# Patient Record
Sex: Female | Born: 1950
Health system: Southern US, Community
[De-identification: ages and names within clinical notes are randomized; demographics above are authoritative.]

## PROBLEM LIST (undated history)

## (undated) DIAGNOSIS — M1990 Unspecified osteoarthritis, unspecified site: Secondary | ICD-10-CM

## (undated) DIAGNOSIS — R0600 Dyspnea, unspecified: Secondary | ICD-10-CM

## (undated) DIAGNOSIS — E669 Obesity, unspecified: Secondary | ICD-10-CM

## (undated) DIAGNOSIS — F4310 Post-traumatic stress disorder, unspecified: Secondary | ICD-10-CM

## (undated) DIAGNOSIS — R531 Weakness: Secondary | ICD-10-CM

## (undated) DIAGNOSIS — H409 Unspecified glaucoma: Secondary | ICD-10-CM

## (undated) DIAGNOSIS — L853 Xerosis cutis: Secondary | ICD-10-CM

## (undated) DIAGNOSIS — M51379 Other intervertebral disc degeneration, lumbosacral region without mention of lumbar back pain or lower extremity pain: Secondary | ICD-10-CM

## (undated) DIAGNOSIS — G8929 Other chronic pain: Secondary | ICD-10-CM

## (undated) DIAGNOSIS — Z8659 Personal history of other mental and behavioral disorders: Secondary | ICD-10-CM

## (undated) DIAGNOSIS — M8580 Other specified disorders of bone density and structure, unspecified site: Secondary | ICD-10-CM

## (undated) DIAGNOSIS — F329 Major depressive disorder, single episode, unspecified: Secondary | ICD-10-CM

## (undated) DIAGNOSIS — M2550 Pain in unspecified joint: Secondary | ICD-10-CM

## (undated) DIAGNOSIS — Z87820 Personal history of traumatic brain injury: Secondary | ICD-10-CM

## (undated) DIAGNOSIS — Z8619 Personal history of other infectious and parasitic diseases: Secondary | ICD-10-CM

## (undated) DIAGNOSIS — R238 Other skin changes: Secondary | ICD-10-CM

## (undated) DIAGNOSIS — Z9889 Other specified postprocedural states: Secondary | ICD-10-CM

## (undated) DIAGNOSIS — J449 Chronic obstructive pulmonary disease, unspecified: Secondary | ICD-10-CM

## (undated) DIAGNOSIS — M549 Dorsalgia, unspecified: Secondary | ICD-10-CM

## (undated) DIAGNOSIS — K5730 Diverticulosis of large intestine without perforation or abscess without bleeding: Secondary | ICD-10-CM

## (undated) DIAGNOSIS — K581 Irritable bowel syndrome with constipation: Secondary | ICD-10-CM

## (undated) DIAGNOSIS — M797 Fibromyalgia: Secondary | ICD-10-CM

## (undated) DIAGNOSIS — F419 Anxiety disorder, unspecified: Secondary | ICD-10-CM

## (undated) DIAGNOSIS — G500 Trigeminal neuralgia: Secondary | ICD-10-CM

## (undated) DIAGNOSIS — M810 Age-related osteoporosis without current pathological fracture: Secondary | ICD-10-CM

## (undated) DIAGNOSIS — M5137 Other intervertebral disc degeneration, lumbosacral region: Secondary | ICD-10-CM

## (undated) DIAGNOSIS — K6282 Dysplasia of anus: Secondary | ICD-10-CM

## (undated) DIAGNOSIS — G4700 Insomnia, unspecified: Secondary | ICD-10-CM

## (undated) DIAGNOSIS — Z8709 Personal history of other diseases of the respiratory system: Secondary | ICD-10-CM

## (undated) DIAGNOSIS — R7303 Prediabetes: Secondary | ICD-10-CM

## (undated) DIAGNOSIS — R42 Dizziness and giddiness: Secondary | ICD-10-CM

## (undated) DIAGNOSIS — K5909 Other constipation: Secondary | ICD-10-CM

## (undated) DIAGNOSIS — N3010 Interstitial cystitis (chronic) without hematuria: Secondary | ICD-10-CM

## (undated) DIAGNOSIS — Z8489 Family history of other specified conditions: Secondary | ICD-10-CM

## (undated) DIAGNOSIS — R350 Frequency of micturition: Secondary | ICD-10-CM

## (undated) DIAGNOSIS — H269 Unspecified cataract: Secondary | ICD-10-CM

## (undated) DIAGNOSIS — R112 Nausea with vomiting, unspecified: Secondary | ICD-10-CM

## (undated) DIAGNOSIS — K219 Gastro-esophageal reflux disease without esophagitis: Secondary | ICD-10-CM

## (undated) DIAGNOSIS — R3915 Urgency of urination: Secondary | ICD-10-CM

## (undated) DIAGNOSIS — K589 Irritable bowel syndrome without diarrhea: Secondary | ICD-10-CM

## (undated) DIAGNOSIS — M2540 Effusion, unspecified joint: Secondary | ICD-10-CM

## (undated) DIAGNOSIS — D649 Anemia, unspecified: Secondary | ICD-10-CM

## (undated) DIAGNOSIS — M5030 Other cervical disc degeneration, unspecified cervical region: Secondary | ICD-10-CM

## (undated) DIAGNOSIS — F32A Depression, unspecified: Secondary | ICD-10-CM

## (undated) DIAGNOSIS — F411 Generalized anxiety disorder: Secondary | ICD-10-CM

## (undated) DIAGNOSIS — C4490 Unspecified malignant neoplasm of skin, unspecified: Secondary | ICD-10-CM

## (undated) DIAGNOSIS — F410 Panic disorder [episodic paroxysmal anxiety] without agoraphobia: Secondary | ICD-10-CM

## (undated) DIAGNOSIS — I10 Essential (primary) hypertension: Secondary | ICD-10-CM

## (undated) HISTORY — PX: COLONOSCOPY WITH ESOPHAGOGASTRODUODENOSCOPY (EGD): SHX5779

## (undated) HISTORY — PX: LEG SURGERY: SHX1003

## (undated) HISTORY — PX: ESOPHAGOGASTRODUODENOSCOPY: SHX1529

## (undated) HISTORY — DX: Obesity, unspecified: E66.9

## (undated) HISTORY — PX: VAGINAL HYSTERECTOMY: SUR661

## (undated) HISTORY — DX: Chronic obstructive pulmonary disease, unspecified: J44.9

## (undated) HISTORY — PX: FOOT SURGERY: SHX648

## (undated) HISTORY — DX: Interstitial cystitis (chronic) without hematuria: N30.10

## (undated) HISTORY — PX: COLONOSCOPY: SHX174

---

## 1998-03-06 ENCOUNTER — Other Ambulatory Visit: Admission: RE | Admit: 1998-03-06 | Discharge: 1998-03-06 | Payer: Self-pay | Admitting: Obstetrics and Gynecology

## 1998-11-19 ENCOUNTER — Emergency Department (HOSPITAL_COMMUNITY): Admission: EM | Admit: 1998-11-19 | Discharge: 1998-11-19 | Payer: Self-pay | Admitting: Emergency Medicine

## 1998-11-19 ENCOUNTER — Encounter: Payer: Self-pay | Admitting: Urology

## 1998-11-19 ENCOUNTER — Ambulatory Visit (HOSPITAL_COMMUNITY): Admission: RE | Admit: 1998-11-19 | Discharge: 1998-11-19 | Payer: Self-pay | Admitting: Urology

## 1998-11-20 ENCOUNTER — Encounter: Payer: Self-pay | Admitting: Urology

## 1998-11-20 ENCOUNTER — Ambulatory Visit (HOSPITAL_COMMUNITY): Admission: RE | Admit: 1998-11-20 | Discharge: 1998-11-20 | Payer: Self-pay | Admitting: Urology

## 1999-02-04 ENCOUNTER — Encounter: Payer: Self-pay | Admitting: Gastroenterology

## 1999-02-04 ENCOUNTER — Ambulatory Visit (HOSPITAL_COMMUNITY): Admission: RE | Admit: 1999-02-04 | Discharge: 1999-02-04 | Payer: Self-pay | Admitting: Gastroenterology

## 1999-02-20 ENCOUNTER — Encounter: Payer: Self-pay | Admitting: Gastroenterology

## 1999-02-20 ENCOUNTER — Ambulatory Visit (HOSPITAL_COMMUNITY): Admission: RE | Admit: 1999-02-20 | Discharge: 1999-02-20 | Payer: Self-pay | Admitting: Gastroenterology

## 1999-02-27 ENCOUNTER — Encounter: Admission: RE | Admit: 1999-02-27 | Discharge: 1999-05-28 | Payer: Self-pay | Admitting: Gastroenterology

## 1999-03-11 ENCOUNTER — Other Ambulatory Visit: Admission: RE | Admit: 1999-03-11 | Discharge: 1999-03-11 | Payer: Self-pay | Admitting: Obstetrics and Gynecology

## 2000-03-12 ENCOUNTER — Other Ambulatory Visit: Admission: RE | Admit: 2000-03-12 | Discharge: 2000-03-12 | Payer: Self-pay | Admitting: Obstetrics and Gynecology

## 2001-04-20 ENCOUNTER — Other Ambulatory Visit: Admission: RE | Admit: 2001-04-20 | Discharge: 2001-04-20 | Payer: Self-pay | Admitting: Obstetrics and Gynecology

## 2004-05-22 ENCOUNTER — Other Ambulatory Visit: Admission: RE | Admit: 2004-05-22 | Discharge: 2004-05-22 | Payer: Self-pay | Admitting: Obstetrics and Gynecology

## 2004-05-31 ENCOUNTER — Emergency Department (HOSPITAL_COMMUNITY): Admission: EM | Admit: 2004-05-31 | Discharge: 2004-06-01 | Payer: Self-pay | Admitting: Emergency Medicine

## 2004-06-01 ENCOUNTER — Ambulatory Visit: Payer: Self-pay | Admitting: Psychiatry

## 2004-06-01 ENCOUNTER — Inpatient Hospital Stay (HOSPITAL_COMMUNITY): Admission: EM | Admit: 2004-06-01 | Discharge: 2004-06-03 | Payer: Self-pay | Admitting: Psychiatry

## 2004-08-09 ENCOUNTER — Ambulatory Visit: Payer: Self-pay | Admitting: Psychiatry

## 2004-08-09 ENCOUNTER — Other Ambulatory Visit (HOSPITAL_COMMUNITY): Admission: RE | Admit: 2004-08-09 | Discharge: 2004-11-07 | Payer: Self-pay | Admitting: Psychiatry

## 2005-07-14 HISTORY — PX: ABDOMINAL HYSTERECTOMY: SHX81

## 2005-12-01 ENCOUNTER — Inpatient Hospital Stay (HOSPITAL_COMMUNITY): Admission: RE | Admit: 2005-12-01 | Discharge: 2005-12-02 | Payer: Self-pay | Admitting: Obstetrics and Gynecology

## 2005-12-01 ENCOUNTER — Encounter (INDEPENDENT_AMBULATORY_CARE_PROVIDER_SITE_OTHER): Payer: Self-pay | Admitting: *Deleted

## 2009-03-01 ENCOUNTER — Ambulatory Visit (HOSPITAL_COMMUNITY): Admission: RE | Admit: 2009-03-01 | Discharge: 2009-03-01 | Payer: Self-pay | Admitting: Rheumatology

## 2009-05-14 HISTORY — PX: FLAT FOOT CORRECTION: SHX6619

## 2009-05-28 ENCOUNTER — Ambulatory Visit (HOSPITAL_BASED_OUTPATIENT_CLINIC_OR_DEPARTMENT_OTHER): Admission: RE | Admit: 2009-05-28 | Discharge: 2009-05-29 | Payer: Self-pay | Admitting: Orthopedic Surgery

## 2010-04-02 ENCOUNTER — Emergency Department (HOSPITAL_COMMUNITY): Admission: EM | Admit: 2010-04-02 | Discharge: 2010-04-02 | Payer: Self-pay | Admitting: Emergency Medicine

## 2010-10-31 ENCOUNTER — Other Ambulatory Visit (HOSPITAL_COMMUNITY): Payer: Self-pay | Admitting: Rheumatology

## 2010-10-31 DIAGNOSIS — M25512 Pain in left shoulder: Secondary | ICD-10-CM

## 2010-11-05 ENCOUNTER — Ambulatory Visit (HOSPITAL_COMMUNITY)
Admission: RE | Admit: 2010-11-05 | Discharge: 2010-11-05 | Disposition: A | Discharge status: Home / Self Care | Payer: Self-pay | Source: Ambulatory Visit | Attending: Rheumatology | Admitting: Rheumatology

## 2010-11-05 DIAGNOSIS — M719 Bursopathy, unspecified: Secondary | ICD-10-CM | POA: Insufficient documentation

## 2010-11-05 DIAGNOSIS — M25519 Pain in unspecified shoulder: Secondary | ICD-10-CM | POA: Insufficient documentation

## 2010-11-05 DIAGNOSIS — M25512 Pain in left shoulder: Secondary | ICD-10-CM

## 2010-11-05 DIAGNOSIS — M67919 Unspecified disorder of synovium and tendon, unspecified shoulder: Secondary | ICD-10-CM | POA: Insufficient documentation

## 2010-11-05 DIAGNOSIS — M25419 Effusion, unspecified shoulder: Secondary | ICD-10-CM | POA: Insufficient documentation
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
## 2010-11-29 NOTE — H&P (Signed)
NAME:  [name], SOLIVAN NO.:  [id number]   MEDICAL RECORD NO.:  [id number]          PATIENT TYPE:  AMB   LOCATION:  SDC                           [facility]ILITY:  [facility]   PHYSICIAN:  Randye Lobo, M.D.   DATE OF BIRTH:  1950/10/22   DATE OF ADMISSION:  11/30/2005  DATE OF DISCHARGE:                                HISTORY & PHYSICAL   CHIEF COMPLAINT:  Vaginal prolapse and urinary incontinence.   HISTORY OF PRESENT ILLNESS:  The patient is a 60 year old, gravida 70, para 46-  0-5-2, Caucasian female with a last menstrual period in January of 2007, who  presented to her primary gynecologist, Dr. Malva Limes, for her annual  examination in March at which time she reported vaginal prolapse and urinary  incontinence of a two-year duration.  The patient was reporting pelvic  pressure and low back.   On physical exam, she was noted to have a cystocele and a rectocele.   The patient was sent for urologic evaluation due to a complaint of leakage  of urine with coughing, sneezing, running and jumping.  The patient also  reported difficulty with voiding and a urinary frequency of one to two times  per hour and a nocturnal frequency of two to three times per h.s.  The  patient does have a history of interstitial cystitis and is controlling her  symptoms with pH science which she uses to neutralize the acid in the  foods she consumes.  The patient denies any history of constipation or fecal  incontinence.   The patient had multiple channel urodynamic testing performed in the office  on September 30, 2005 at which time she was noted to have a uroflow study with a  void of 134 mL and a postvoid residual of 135 mL.  Her ` study documented a  leak point pressure of 48 cm of water.  The patient's pressure flow study,  she had a maximum detrusor pressure of 55 cm of water.   The patient wishes for surgical treatment of her prolapse and incontinence.   PAST OBSTETRIC AND GYNECOLOGIC:  The  patient had three prior vaginal  deliveries.  The patient has had five pregnancy losses.  The patient's last  Pap smear on chart review is dated May 22, 2004 and this was within  normal limits.  Her last mammogram was performed September 29, 2005 and this  showed benign calcifications in the right breast.   PAST MEDICAL HISTORY:  1.  Environmental allergies.  2.  Food allergies.  3.  Multiple drug allergies:  PENICILLIN produces a rash, KEFLEX produces an      uncertain reaction, TETRACYCLINE makes the patient feel agitated,      NONOXYNOL-9 also causes a type of reaction of which the patient is not      certain and she also has an allergy to GEODON, also to METAMUCIL.  4.  Interstitial cystitis.  5.  Low back pain.   PAST SURGICAL HISTORY:  1.  Status post D&C x2.  2.  Status post voluntary interruption of pregnancy x1.  MEDICATIONS:  The patient takes multiple herbs and she was told to stop  these at her preoperative visit on Nov 26, 2005.   ALLERGIES:  Please refer to past medical history.   SOCIAL HISTORY:  The patient is married.  She is currently unemployed.  She  denies the use of tobacco.   PHYSICAL EXAMINATION:  The patient is a middle-aged Caucasian female in no  acute distress.  Lungs clear to auscultation bilaterally.  Heart S1 and S2  with a regular rate and rhythm.  Abdomen is soft and nontender without  evidence of hepatosplenomegaly or organomegaly.  Pelvic examination normal  external genitalia and urethra.  There is a second to third degree  cystocele, first degree uterine prolapse and a second degree rectocele.  The  uterus is small and nontender.  No adnexal masses or tenderness are  appreciated.   IMPRESSION:  The patient is a 60 year old para 2 female with symptomatic and  complete uterovaginal prolapse and urodynamically proven genuine stress  incontinence.  The patient does have a history of interstitial cystitis.   PLAN:  The patient will undergo a  total vaginal hysterectomy by Dr.  Dareen Piano.  I will assist him with this procedure and I will then perform an  anterior and posterior colporrhaphy with a tension-free vaginal tape and  cystoscopy.  Dr. Dareen Piano will be assisting me for my portion of the  surgery.  Risks, benefits, and alternatives of the above have been discussed  with the patient who wishes to proceed. Surgery is scheduled for [facility] at 12 p.m. on Dec 01, 2005.      Randye Lobo, M.D.  Electronically Signed     BES/MEDQ  D:  11/30/2005  T:  11/30/2005  Job:  [id number]
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
## 2010-11-29 NOTE — Op Note (Signed)
[name], [name] NO.:  [id number]   MEDICAL RECORD NO.:  [id number]          PATIENT TYPE:  INP   LOCATION:  9399                          [facility]ILITY[facility]  [facility]   PHYSICIAN:  Randye Lobo, M.D.   DATE OF BIRTH:  06/18/1951   DATE OF PROCEDURE:  12/01/2005  DATE OF DISCHARGE:                                 OPERATIVE REPORT   PREOPERATIVE DIAGNOSIS:  1.  Incomplete uterovaginal prolapse.  2.  Genuine stress incontinence.   POSTOPERATIVE DIAGNOSIS:  1.  Incomplete uterovaginal prolapse.  2.  Genuine stress incontinence.   PROCEDURE:  McCall culdoplasty, anterior and posterior colporrhaphy, tension-  free vaginal tape, cystoscopy.   SURGEON:  Conley Simmonds, MD   ASSISTANT:  Emma Limes, MD   ANESTHESIA:  General endotracheal, local with 1% lidocaine with epinephrine  1:100,000.   TOTAL IV FLUIDS:  2100 mL Ringer's lactate.   ESTIMATED BLOOD LOSS:  300 mL   URINE OUTPUT:  300 mL.   COMPLICATIONS:  None.   INDICATIONS FOR PROCEDURE:  The patient is a 60 year old gravida 8, para 3-0-  5-2 Caucasian female who presented to her primary gynecologist, Dr. Malva [name] for annual examination and at that time reported vaginal prolapse  and urinary incontinence.  The patient was noted to have both a cystocele  and a rectocele.  She was sent for urogynecologic evaluation and underwent  urodynamic testing at which time I confirmed the presence of genuine stress  incontinence.  The patient does have a history significant for interstitial  cystitis and currently takes no medications, but does control her symptoms  with what she calls pH science to neutralize the acid in the foods she  consumes.  On physical examination, the patient was noted to have a third  degree cystocele, first degree uterine prolapse and a second-degree  rectocele.  The plan is to proceed with a total vaginal hysterectomy by Dr.  Dareen Piano and I will then perform an anterior and posterior  colporrhaphy with  a tension-free vaginal tape and cystoscopy.  Risks, benefits, and  alternatives have been discussed with the patient who wishes to proceed.   FINDINGS:  Examination under anesthesia revealed a second-degree cystocele  and first to second degree uterine prolapse and a second-degree rectocele.   Cystoscopy performed and during the sling procedure document to the absence  of a foreign body in the bladder or the urethra.  The bladder was visualized  throughout 360 degrees and had a normal bladder dome and trigone.  There was  evidence of patency of the ureters bilaterally.  After the bladder had been  filled with approximately 450-500 mL of sterile fluid, small petechial  hemorrhages were noted to appear throughout the bladder.   SPECIMENS:  None.   PROCEDURE:  The patient was brought to the operating room by Dr. Dareen Piano  and she received a general endotracheal anesthetic.  She was placed in the  dorsal lithotomy position and she was sterilely prepped and draped.  The  patient had her vaginal hysterectomy performed by Dr. Dareen Piano which was  without complication.  Please  refer to this dictation separately.   At the end of the physterectomy, the peritoneal cavity was opened and Dr.  Dareen Piano had closed the posterior vaginal cuff with a running locked suture.  The hemostasis was good.   I first performed a McCall culdoplasty using 0 Vicryl suture.  The suture  was brought through the vagina at the 6 o'clock position through the distal  left uterosacral ligament, across the posterior cul-de-sac in a pursestring  fashion and then down through the right distal right uterosacral ligament  before coming out the vagina at the 6 o'clock position.  The suture was held  until the end of the case at which time it was tied for excellent cuff  support and elevation.   Allis clamps were used and a Foley catheter was placed in the bladder.  Allis clamps were used to mark the  midline of the anterior vaginal wall  which was then injected with 1% lidocaine with 1:100,000.  The vaginal  mucosa was then incised vertically with Metzenbaum scissors.  The endopelvic  fascia was dissected off of the vaginal mucosa bilaterally all the way back  to the pubic rami.  Hemostasis was created with monopolar cautery during the  course of the dissection.  There were some small bleeding the bladder both  on the right and left side which needed to be sutured with figure-of-eight  sutures of 2-0 Vicryl to create good hemostasis.   The TVT sling was then performed in a top-down fashion.  A suprapubic 1 cm  incision was created 2 cm to the right and left of the midline using a  scalpel.  The abdominal needle passer was brought through the suprapubic  incision and out through the vagina on the ipsilateral side lateral to the  mid urethra.  The same procedure that was performed on the right-hand side  was then repeated on the left-hand side again in a top down the fashion.  The Foley catheter was removed and cystoscopy was performed at this time and  the findings were as noted above.  The cystoscopic fluid was then drained  from the bladder and the Foley catheter was replaced.  The sling was then  attached to the abdominal needle passers and drawn up through the suprapubic  incisions.  The plastic sheaths were separated from the sling and the  plastic sheaths were withdrawn while placing a Kelly clamp between the  urethra and the sling itself.  There was good mobility of the Kelly clamp  underneath 0 Vicryl distally.  This provided excellent reduction of the  cystocele.  Gelfoam was placed near the vaginal exit sites of the sling to  improve hemostasis.  Excess vaginal mucosa was then trimmed anteriorly and  the anterior vaginal wall was closed with a running locked suture of 2-0 Vicryl.  The vaginal cuff was closed with a running locked suture of 0  Vicryl.   The posterior  colporrhaphy was performed last.  Allis clamps were used to  mark the midline of the posterior vaginal wall to approximately 3 cm below  the vaginal cuff.  The posterior vaginal wall mucosa was injected with 1%  lidocaine with 1:100,000 of epinephrine.  A triangular wedge of epithelium  was excised from the perineal body and the posterior vaginal wall was  incised vertically with the Metzenbaum scissors.  The perirectal fascia was  dissected off of the overlying mucosa bilaterally.  Hemostasis during the  dissection was created with monopolar cautery.  The  rectocele was reduced by  placing a series of vertical mattress sutures of 0 Vicryl and a crown stitch  of 0 Vicryl was placed in the perineal body.  Excess vaginal mucosa was  trimmed and the posterior vaginal wall was closed with a running locked in a  subcuticular fashion as for an episiotomy.  The knot was tied at the hymen.   The culdoplasty suture was tied at this time and there was good support and  elevation of the vaginal cuff.  The suprapubic incisions were closed with  Dermabond.  A vaginal packing with Estrace cream, this concluded the  patient's procedure.  Rectal exam confirmed that absence of sutures in the  rectum.  There were no complications to the procedure.  All needle,  instrument, sponge counts were correct.  The patient is escorted to the  recovery room in stable and awake condition.      Randye Lobo, M.D.  Electronically Signed     BES/MEDQ  D:  12/01/2005  T:  12/02/2005  Job:  [id number]

## 2010-11-29 NOTE — Op Note (Signed)
NAME:  [name], [name] NO.:  [id number]   MEDICAL RECORD NO.:  [id number]          PATIENT TYPE:  AMB   LOCATION:  SDC                           [facility]ILITY[facility]   PHYSICIAN:  Malva Limes, M.D.    DATE OF BIRTH:  15-Dec-1950   DATE OF PROCEDURE:  12/01/2005  DATE OF DISCHARGE:                                 OPERATIVE REPORT   PREOPERATIVE DIAGNOSIS:  Symptomatic uterine prolapse.   POSTOPERATIVE DIAGNOSIS:  Symptomatic uterine prolapse.   PROCEDURE:  Total vaginal hysterectomy with bilateral salpingo-oophorectomy.   SURGEON:  Malva Limes, M.D.   ASSISTANT:  Randye Lobo, M.D.   ANESTHESIA:  General endotracheal.   ANTIBIOTICS:  Ancef 1 gram.   DRAINS:  Foley bedside drainage.   ESTIMATED BLOOD LOSS:  100 mL.   COMPLICATIONS:  None.   SPECIMENS:  Cervix, uterus, fallopian tubes and ovaries sent to pathology.   PROCEDURE:  The patient was taken to the operating room where she was placed  in dorsal supine position and general anesthetic was administered without  complications.  She was then placed in the dorsal lithotomy position.  She  was prepped with Hibiclens and draped in the usual fashion for this  procedure.  Her bladder was drained with a nonlatex catheter.  A weighted  speculum was placed in the vagina. 10 mL of 1% lidocaine with epinephrine  was injected in the cervix, posterior cul-de-sac was then entered.  Uterosacral ligaments were bilaterally clamped, cut and ligated with 0  Monocryl suture.  The remaining cervix was circumscribed.  The anterior cul-  de-sac was entered sharply.  The cardinal ligaments were serially clamped,  cut and ligated with 0 Monocryl suture.  The uterine arteries were  bilaterally clamped, cut and ligated with 0 Monocryl suture.  Once the level  of the round ligament and fallopian tube was reached, the triple pedicle  which included the fallopian tube, ovarian ligaments and round ligaments  were bilaterally  clamped and ligated x2 with 0 Monocryl suture.  Once this  was accomplished both ovaries were inspected and found to be normal.  At  this point the infundibulopelvic ligament was isolated, clamped, cut and  ligated x2 with 0 Monocryl suture.  Fallopian tube and ovary were removed  bilaterally.  There was some bleeding on the right infundibulopelvic  ligament identified.  Two more sutures of 0 Monocryl suture were placed in  this area and hemostasis was obtained. At this point the posterior vagina  was closed using 2-0 Vicryl in a running locking fashion.  Following this,  Dr. Edward Jolly took over the surgery where she performed anterior-posterior  colporrhaphy along with placement of a TVT tape and cystoscopy.  Those  procedures will be dictated by Dr. Edward Jolly.           ______________________________  Malva Limes, M.D.     MA/MEDQ  D:  12/01/2005  T:  12/02/2005  Job:  [id number]

## 2010-11-29 NOTE — H&P (Signed)
[name], [name] NO.:  [id number]   MEDICAL RECORD NO.:  [id number]          PATIENT TYPE:  INP   LOCATION:  9318                          FACILITY:  [facility]   PHYSICIAN:  Malva Limes, M.D.    DATE OF BIRTH:  February 12, 1951   DATE OF ADMISSION:  12/01/2005  DATE OF DISCHARGE:                                HISTORY & PHYSICAL   [name] is a 60 year old white female G8, P3-0-5-2 who presents to  [facility] for total vaginal hysterectomy, bilateral salpingo-  oophorectomy, anterior and posterior colporrhaphy, and urethral sling  secondary to a several-year history of worsening symptoms of pelvic prolapse  and stress urinary incontinence.  Patient has been complaining of pelvic  pressure and low back pain for the last two years.  It has increased over  the last several months.  Patient feels like things are falling out of her  vagina.  Patient also has hot flashes and is menopausal.  Patient has  undergone an extensive evaluation with urodynamics with Dr. Conley Simmonds.  She will be performing the anterior and posterior colporrhaphy along with  the sling.   PAST MEDICAL HISTORY:  Patient has allergies to PENICILLIN, TETRACYCLINE,  CEPHALOSPORINS.  She is a nonsmoker.   CURRENT MEDICATIONS:  Only herbs.   FAMILY HISTORY:  Significant for diabetes and cardiovascular disease.  Patient has had three vaginal deliveries, three D&Cs.   PHYSICAL EXAMINATION:  VITAL SIGNS:  Stable.  She is afebrile.  HEENT:  Within normal limits.  LUNGS:  Clear to auscultation bilaterally.  CARDIOVASCULAR:  Regular rate and rhythm without murmur.  BREASTS:  Nontender.  There is no lymphadenopathy or masses.  ABDOMEN:  Soft, nondistended.  There are no palpable masses or organomegaly.  EXTREMITIES:  Within normal limits.  PELVIC:  First degree prolapse.  Normal sized uterus and cervix.  There are  no adnexal masses.  Patient has a large rectocele and cystocele.   IMPRESSION:   Symptomatic uterine prolapse with stress urinary incontinence.   PLAN:  Proceed with total vaginal hysterectomy, bilateral salpingo-  oophorectomy, anterior and posterior colporrhaphy, and placement of a  urethral sling.           ______________________________  Malva Limes, M.D.     MA/MEDQ  D:  12/02/2005  T:  12/02/2005  Job:  [id number]

## 2011-06-21 ENCOUNTER — Emergency Department (HOSPITAL_COMMUNITY)
Admission: EM | Admit: 2011-06-21 | Discharge: 2011-06-21 | Disposition: A | Discharge status: Home / Self Care | Payer: BC Managed Care – PPO | Attending: Emergency Medicine | Admitting: Emergency Medicine

## 2011-06-21 ENCOUNTER — Encounter (HOSPITAL_COMMUNITY): Payer: Self-pay | Admitting: Emergency Medicine

## 2011-06-21 DIAGNOSIS — W5501XA Bitten by cat, initial encounter: Secondary | ICD-10-CM

## 2011-06-21 DIAGNOSIS — L0390 Cellulitis, unspecified: Secondary | ICD-10-CM

## 2011-06-21 DIAGNOSIS — Y92009 Unspecified place in unspecified non-institutional (private) residence as the place of occurrence of the external cause: Secondary | ICD-10-CM | POA: Insufficient documentation

## 2011-06-21 DIAGNOSIS — S61409A Unspecified open wound of unspecified hand, initial encounter: Secondary | ICD-10-CM | POA: Insufficient documentation

## 2011-06-21 DIAGNOSIS — L0291 Cutaneous abscess, unspecified: Secondary | ICD-10-CM | POA: Insufficient documentation

## 2011-06-21 DIAGNOSIS — IMO0001 Reserved for inherently not codable concepts without codable children: Secondary | ICD-10-CM | POA: Insufficient documentation

## 2011-06-21 DIAGNOSIS — M797 Fibromyalgia: Secondary | ICD-10-CM | POA: Insufficient documentation

## 2011-06-21 HISTORY — DX: Unspecified osteoarthritis, unspecified site: M19.90

## 2011-06-21 HISTORY — DX: Fibromyalgia: M79.7

## 2011-06-21 MED ORDER — CLINDAMYCIN HCL 300 MG PO CAPS
450.0000 mg | ORAL_CAPSULE | ORAL | Status: AC
  Administered 2011-06-21: 150 mg via ORAL
  Filled 2011-06-21: qty 1

## 2011-06-21 MED ORDER — CLINDAMYCIN HCL 150 MG PO CAPS: 450.0000 mg | ORAL_CAPSULE | Freq: Three times a day (TID) | ORAL | Status: AC

## 2011-06-21 MED ORDER — HYDROCODONE-ACETAMINOPHEN 5-325 MG PO TABS: ORAL_TABLET | ORAL | Status: AC

## 2011-06-21 MED ORDER — HYDROCODONE-ACETAMINOPHEN 5-325 MG PO TABS: 1.0000 | ORAL_TABLET | Freq: Once | ORAL | Status: DC

## 2011-06-21 MED ORDER — DOXYCYCLINE HYCLATE 100 MG PO TABS
100.0000 mg | ORAL_TABLET | Freq: Once | ORAL | Status: AC
  Administered 2011-06-21: 100 mg via ORAL
  Filled 2011-06-21 (×2): qty 1

## 2011-06-21 MED ORDER — DOXYCYCLINE HYCLATE 100 MG PO CAPS: 100.0000 mg | ORAL_CAPSULE | Freq: Two times a day (BID) | ORAL | Status: AC

## 2011-06-21 NOTE — ED Provider Notes (Signed)
Medical screening examination/treatment/procedure(s) were performed by non-physician practitioner and as supervising physician I was immediately available for consultation/collaboration.   Geoffery Lyons, MD 06/21/11 [phone]

## 2011-06-21 NOTE — ED Notes (Signed)
Hx of recent bite-same animal

## 2011-06-21 NOTE — ED Provider Notes (Signed)
History     CSN: [id number] Arrival date & time: 06/21/2011  9:05 AM   First MD Initiated Contact with Patient 06/21/11 [phone]      No chief complaint on file.   (Consider location/radiation/quality/duration/timing/severity/associated sxs/prior treatment) HPI Comments: Patient bitten yesterday by her own cat on her right hand. She has had worsening redness, swelling, and pain of her right hand at the base of her third fourth and fifth digits. No drainage from the wound. Patient denies fever, nausea, vomiting, or any other symptoms. The patient washed the wound yesterday with warm water and alcohol. Normal sensation and movement of the fingers with mild amount of pain.  Patient is a 60 y.o. female presenting with animal bite. The history is provided by the patient.  Animal Bite  The incident occurred yesterday. The incident occurred at home. She came to the ER via personal transport. The pain is moderate. It is unlikely that a foreign body is present. Pertinent negatives include no numbness and no weakness. She is right-handed. She has received no recent medical care.    No past medical history on file.  No past surgical history on file.  No family history on file.  History  Substance Use Topics  . Smoking status: Not on file  . Smokeless tobacco: Not on file  . Alcohol Use: Not on file    OB History    No data available      Review of Systems  Constitutional: Negative for fever.  Musculoskeletal: Negative for joint swelling and arthralgias.  Skin: Positive for rash and wound. Negative for color change.  Neurological: Negative for weakness and numbness.    Allergies  Abilify; Ace inhibitors; Codeine; Effexor; Geodon; Lamictal; Lexapro; Lithium; Nortriptyline; Prozac; Suprax; Zoloft; Erythromycin; and Penicillins  Home Medications   Current Outpatient Rx  Name Route Sig Dispense Refill  . ACETAMINOPHEN ER 650 MG PO TBCR Oral Take 650 mg by mouth every 8 (eight) hours as  needed. pain     . CALCIUM CARBONATE-VITAMIN D 500-200 MG-UNIT PO TABS Oral Take 1 tablet by mouth daily.      Marland Kitchen DIAZEPAM 10 MG PO TABS Oral Take 10 mg by mouth every 8 (eight) hours as needed.      Marland Kitchen DICLOFENAC SODIUM 1 % TD GEL Topical Apply topically.      . DULOXETINE HCL 30 MG PO CPEP Oral Take 30 mg by mouth daily.      Marland Kitchen ESTRADIOL 0.1 MG/GM VA CREA Vaginal Place 2 g vaginally 2 (two) times a week.      Marland Kitchen FLUTICASONE PROPIONATE 50 MCG/ACT NA SUSP Nasal Place 2 sprays into the nose daily.      Marland Kitchen GABAPENTIN PO Oral Take 150 mg by mouth 3 (three) times daily.      Carma Leaven M PLUS PO TABS Oral Take 1 tablet by mouth daily.      . TRAMADOL HCL 50 MG PO TABS Oral Take 50 mg by mouth every 6 (six) hours as needed. Maximum dose= 8 tablets per day- for pain     . VILAZODONE HCL 40 MG PO TABS Oral Take 20 mg by mouth daily.      Marland Kitchen VITAMIN C 500 MG PO TABS Oral Take 500 mg by mouth daily.      . ALBUTEROL SULFATE HFA 108 (90 BASE) MCG/ACT IN AERS Inhalation Inhale 2 puffs into the lungs every 4 (four) hours as needed. Shortness of breath     . EPINEPHRINE 0.3 MG/0.3ML  IJ DEVI Intramuscular Inject 0.3 mg into the muscle once.        There were no vitals taken for this visit.  Physical Exam  Nursing note and vitals reviewed. Constitutional: She is oriented to person, place, and time. She appears well-developed and well-nourished.  HENT:  Head: Normocephalic and atraumatic.  Eyes: Pupils are equal, round, and reactive to light.  Neck: Normal range of motion. Neck supple.  Musculoskeletal: Normal range of motion. She exhibits edema and tenderness.       Swelling and redness at the base of third, fourth, and fifth digits on the right hand. There are several small scabbed areas consistent with puncture wound. Area is mildly warm to touch. Patient has full range of motion in all fingers and no signs consistent with flexor tenosynovitis.  Neurological: She is alert and oriented to person, place, and  time.       Distal motor, sensation, and vascular intact.   Skin: Skin is warm and dry. There is erythema.  Psychiatric: She has a normal mood and affect. Her behavior is normal.    ED Course  Procedures (including critical care time)  Labs Reviewed - No data to display No results found.   1. Cellulitis   2. Cat bite    Patient was seen and examined. The wounds are now closed and are unable to be irrigated. There is no drainage. Patient given first dose of antibiotics in the emergency department. Patient was given pain medicine for home. Patient instructed to return to the [facility] emergency department tomorrow or I can reevaluate her hand. She verbalizes understanding and agrees with plan. Patient urged to return sooner with worsening redness streaking up her arm, fever, worsening pain or swelling, or she has any other concerns.   MDM  Patient with cellulitis due to recent cat bite. No flexor tenosynovitis, no compartment syndrome. Patient's tetanus is up-to-date. Will give oral antibiotic trial with 24-hour return for recheck. Doxycycline and clindamycin given to penicillin allergy.        Carolee Rota, [place] 06/21/11 1730

## 2011-06-22 ENCOUNTER — Emergency Department (HOSPITAL_COMMUNITY)
Admission: EM | Admit: 2011-06-22 | Discharge: 2011-06-22 | Disposition: A | Discharge status: Home / Self Care | Payer: BC Managed Care – PPO | Attending: Emergency Medicine | Admitting: Emergency Medicine

## 2011-06-22 ENCOUNTER — Encounter (HOSPITAL_COMMUNITY): Payer: Self-pay

## 2011-06-22 DIAGNOSIS — S61409A Unspecified open wound of unspecified hand, initial encounter: Secondary | ICD-10-CM | POA: Insufficient documentation

## 2011-06-22 DIAGNOSIS — L02519 Cutaneous abscess of unspecified hand: Secondary | ICD-10-CM | POA: Insufficient documentation

## 2011-06-22 DIAGNOSIS — T148XXA Other injury of unspecified body region, initial encounter: Secondary | ICD-10-CM

## 2011-06-22 DIAGNOSIS — IMO0001 Reserved for inherently not codable concepts without codable children: Secondary | ICD-10-CM | POA: Insufficient documentation

## 2011-06-22 DIAGNOSIS — L03119 Cellulitis of unspecified part of limb: Secondary | ICD-10-CM

## 2011-06-22 NOTE — ED Notes (Signed)
Pt in from home states was told to come back in today for wound recheck on the right hand

## 2011-06-22 NOTE — ED Provider Notes (Signed)
History     CSN: [id number] Arrival date & time: 06/22/2011  7:51 AM   First MD Initiated Contact with Patient 06/22/11 [phone]      Chief Complaint  Patient presents with  . Wound Check    (Consider location/radiation/quality/duration/timing/severity/associated sxs/prior treatment) HPI Comments: Patient bitten by on right hand 2 days ago was seen in emergency department by myself yesterday and placed on oral antibiotics. Patient returns today for a wound recheck. Patient reports improvement in the area of redness and swelling but still has significant pain in this area. She states that she has not been taking any pain medicine for pain. She continues to be able to move all fingers. There has been no drainage from the wounds.  Patient is a 60 y.o. female presenting with wound check. The history is provided by the patient.  Wound Check  She was treated in the ED yesterday. Previous treatment in the ED includes oral antibiotics. Treatments since wound repair include oral antibiotics and soaks. There has been no drainage from the wound. The redness has improved. The swelling has improved. The pain has not changed. She has no difficulty moving the affected extremity or digit.    Past Medical History  Diagnosis Date  . Arthritis   . Fibromyalgia     Past Surgical History  Procedure Date  . Abdominal hysterectomy   . Foot surgery     Family History  Problem Relation Age of Onset  . Cancer Mother   . Osteoarthritis Mother     History  Substance Use Topics  . Smoking status: Never Smoker   . Smokeless tobacco: Not on file  . Alcohol Use: No    OB History    Grav Para Term Preterm Abortions TAB SAB Ect Mult Living                  Review of Systems  Constitutional: Negative for fever and chills.  Gastrointestinal: Negative for nausea and vomiting.  Musculoskeletal: Negative for joint swelling and arthralgias.  Skin: Positive for color change and wound.  Neurological:  Negative for weakness and numbness.    Allergies  Abilify; Ace inhibitors; Codeine; Effexor; Geodon; Lamictal; Lexapro; Lithium; Nortriptyline; Prozac; Suprax; Zoloft; Erythromycin; and Penicillins  Home Medications   Current Outpatient Rx  Name Route Sig Dispense Refill  . ACETAMINOPHEN ER 650 MG PO TBCR Oral Take 650 mg by mouth every 8 (eight) hours as needed. pain     . CALCIUM CARBONATE-VITAMIN D 500-200 MG-UNIT PO TABS Oral Take 1 tablet by mouth daily.     Marland Kitchen CLINDAMYCIN HCL 150 MG PO CAPS Oral Take 3 capsules (450 mg total) by mouth 3 (three) times daily. 56 capsule 0  . DIAZEPAM 10 MG PO TABS Oral Take 10 mg by mouth every 8 (eight) hours as needed. For anxiety    . DICLOFENAC SODIUM 1 % TD GEL Topical Apply 1 application topically as needed. For shoulder pain.    Marland Kitchen DOXYCYCLINE HYCLATE 100 MG PO CAPS Oral Take 1 capsule (100 mg total) by mouth 2 (two) times daily. 14 capsule 0  . DULOXETINE HCL 30 MG PO CPEP Oral Take 30 mg by mouth daily.      Marland Kitchen EPINEPHRINE 0.3 MG/0.3ML IJ DEVI Intramuscular Inject 0.3 mg into the muscle once.      Marland Kitchen ESTRADIOL 0.1 MG/GM VA CREA Vaginal Place 2 g vaginally 2 (two) times a week. Monday and Fridays.    Marland Kitchen FLUTICASONE PROPIONATE 50 MCG/ACT NA SUSP  Nasal Place 2 sprays into the nose daily.      Marland Kitchen GABAPENTIN PO Oral Take 150 mg by mouth 3 (three) times daily.     Marland Kitchen HYDROCODONE-ACETAMINOPHEN 5-325 MG PO TABS  Take 1-2 tablets every 6 hours as needed for severe pain 12 tablet 0  . THERA M PLUS PO TABS Oral Take 1 tablet by mouth daily.      Marland Kitchen VILAZODONE HCL 40 MG PO TABS Oral Take 20 mg by mouth daily.      Marland Kitchen VITAMIN C 500 MG PO TABS Oral Take 500 mg by mouth daily.      . ALBUTEROL SULFATE HFA 108 (90 BASE) MCG/ACT IN AERS Inhalation Inhale 2 puffs into the lungs every 4 (four) hours as needed. Shortness of breath     . TRAMADOL HCL 50 MG PO TABS Oral Take 50 mg by mouth every 6 (six) hours as needed. Maximum dose= 8 tablets per day- for pain       BP  126/86  Pulse 75  Temp(Src) 98.2 F (36.8 C) (Oral)  Resp 20  SpO2 100%  Physical Exam  Nursing note and vitals reviewed. Constitutional: She appears well-developed and well-nourished.  HENT:  Head: Normocephalic and atraumatic.  Eyes: Right eye exhibits no discharge. Left eye exhibits no discharge.  Neck: Normal range of motion. Neck supple.  Musculoskeletal: She exhibits edema and tenderness.       Patient with mild swelling at base of third, fourth, and fifth fingers of right hand. This has improved since yesterday. The skin is normal temperature and is not as warm as yesterday. The extent of the erythema is decreased. There is no streaking. The patient has full range of motion in all fingers and there is no sign of flexor tenosynovitis. Overall the swelling is much improved since yesterday.  Neurological: She is alert.       Distal motor, sensation, and vascular intact.   Skin: Skin is warm and dry. There is erythema.  Psychiatric: She has a normal mood and affect. Her behavior is normal.    ED Course  Procedures (including critical care time)  Labs Reviewed - No data to display No results found.   1. Cellulitis of hand   2. Animal bite    8:58 AM patient seen and reexamined. The patient is urged to return to the emergency department or see her primary care doctor in 48 hours for a wound recheck if it has not completely resolved. The patient was counseled to take entire course of antibiotics.  Patient was urged to return sooner if worsening swelling, streaking up her arm, inability to move fingers, fever, or she has any other concerns. Patient verbalizes understanding and agrees with plan.  MDM  Interval improvement of the swelling and redness after cat bite. Patient to continue oral antibiotics and return in 48 hours for a recheck if not greatly improved. No tenosynovitis.       Carolee Rota, [place] 06/22/11 0900

## 2011-06-23 NOTE — ED Provider Notes (Signed)
Evaluation and management procedures were performed by the mid-level provider (PA/NP/CNM) under my supervision/collaboration. I was present and available during the ED course. Vennessa Affinito Y.   Tymira Horkey Y. Armando Bukhari, MD 06/23/11 1152 

## 2011-07-01 ENCOUNTER — Other Ambulatory Visit: Payer: Self-pay | Admitting: Obstetrics and Gynecology

## 2012-05-13 ENCOUNTER — Encounter (HOSPITAL_COMMUNITY): Payer: Self-pay | Admitting: *Deleted

## 2012-05-13 ENCOUNTER — Emergency Department (HOSPITAL_COMMUNITY)
Admission: EM | Admit: 2012-05-13 | Discharge: 2012-05-13 | Disposition: A | Discharge status: Home / Self Care | Payer: BC Managed Care – PPO | Attending: Emergency Medicine | Admitting: Emergency Medicine

## 2012-05-13 DIAGNOSIS — Z79899 Other long term (current) drug therapy: Secondary | ICD-10-CM | POA: Insufficient documentation

## 2012-05-13 DIAGNOSIS — Y929 Unspecified place or not applicable: Secondary | ICD-10-CM | POA: Insufficient documentation

## 2012-05-13 DIAGNOSIS — Y9389 Activity, other specified: Secondary | ICD-10-CM | POA: Insufficient documentation

## 2012-05-13 DIAGNOSIS — S61209A Unspecified open wound of unspecified finger without damage to nail, initial encounter: Secondary | ICD-10-CM | POA: Insufficient documentation

## 2012-05-13 DIAGNOSIS — M129 Arthropathy, unspecified: Secondary | ICD-10-CM | POA: Insufficient documentation

## 2012-05-13 DIAGNOSIS — IMO0001 Reserved for inherently not codable concepts without codable children: Secondary | ICD-10-CM | POA: Insufficient documentation

## 2012-05-13 DIAGNOSIS — S61259A Open bite of unspecified finger without damage to nail, initial encounter: Secondary | ICD-10-CM

## 2012-05-13 MED ORDER — CLINDAMYCIN HCL 300 MG PO CAPS
300.0000 mg | ORAL_CAPSULE | Freq: Once | ORAL | Status: AC
  Administered 2012-05-13: 300 mg via ORAL
  Filled 2012-05-13: qty 1

## 2012-05-13 MED ORDER — CLINDAMYCIN HCL 300 MG PO CAPS: 300.0000 mg | ORAL_CAPSULE | Freq: Three times a day (TID) | ORAL | Status: DC

## 2012-05-13 NOTE — ED Provider Notes (Signed)
History     CSN: [id number]  Arrival date & time 05/13/12  2114   First MD Initiated Contact with Patient 05/13/12 2125      Chief Complaint  Patient presents with  . Animal Bite    (Consider location/radiation/quality/duration/timing/severity/associated sxs/prior treatment) HPI   Pt to the ER for cat bite to right ring finger. It is her cat and it has had its rabies vaccinations. She was bit one year ago by this same cat. She has had it for twenty years and says its old and dying,. She said the episode happened just prior to arrival. She denies noticing any puss coming from it. She is not having any fevers, chills. Denies any other injuries. Denies being immune compromised. nad vss   Past Medical History  Diagnosis Date  . Arthritis   . Fibromyalgia     Past Surgical History  Procedure Date  . Abdominal hysterectomy   . Foot surgery     Family History  Problem Relation Age of Onset  . Cancer Mother   . Osteoarthritis Mother     History  Substance Use Topics  . Smoking status: Never Smoker   . Smokeless tobacco: Not on file  . Alcohol Use: No    OB History    Grav Para Term Preterm Abortions TAB SAB Ect Mult Living                  Review of Systems  Review of Systems  Gen: no weight loss, fevers, chills, night sweats  Eyes: no discharge or drainage, no occular pain or visual changes  Nose: no epistaxis or rhinorrhea  Mouth: no dental pain, no sore throat  Neck: no neck pain  Lungs:No wheezing, coughing or hemoptysis CV: no chest pain, palpitations, dependent edema or orthopnea  Abd: no abdominal pain, nausea, vomiting  GU: no dysuria or gross hematuria  MSK:  Cat bite to right ring finger  Neuro: no headache, no focal neurologic deficits  Skin: no abnormalities Psyche: negative.   Allergies  Ace inhibitors; Aripiprazole; Cefixime; Codeine; Effexor; Escitalopram oxalate; Geodon; Lamictal; Lithium; Nortriptyline; Prozac; Sertraline hcl;  Erythromycin; and Penicillins  Home Medications   Current Outpatient Rx  Name Route Sig Dispense Refill  . ACETAMINOPHEN 500 MG PO TABS Oral Take 1,000 mg by mouth every 6 (six) hours as needed. Pain    . ALBUTEROL SULFATE HFA 108 (90 BASE) MCG/ACT IN AERS Inhalation Inhale 2 puffs into the lungs every 4 (four) hours as needed. Shortness of breath     . CALCIUM CARBONATE-VITAMIN D 500-200 MG-UNIT PO TABS Oral Take 1 tablet by mouth daily.     Marland Kitchen DICLOFENAC SODIUM 1 % TD GEL Topical Apply 1 application topically as needed. For shoulder pain.    . DULOXETINE HCL 30 MG PO CPEP Oral Take 30 mg by mouth 2 (two) times daily.     Marland Kitchen EPINEPHRINE 0.3 MG/0.3ML IJ DEVI Intramuscular Inject 0.3 mg into the muscle once.      Marland Kitchen FLUTICASONE PROPIONATE 50 MCG/ACT NA SUSP Nasal Place 2 sprays into the nose daily.      Marland Kitchen GABAPENTIN 600 MG PO TABS Oral Take 1,200 mg by mouth 3 (three) times daily.    Carma Leaven M PLUS PO TABS Oral Take 1 tablet by mouth daily.      . TRAMADOL HCL 50 MG PO TABS Oral Take 50 mg by mouth every 6 (six) hours as needed. Maximum dose= 8 tablets per day- for pain     .  VITAMIN C 500 MG PO TABS Oral Take 500 mg by mouth daily.      Marland Kitchen CLINDAMYCIN HCL 300 MG PO CAPS Oral Take 1 capsule (300 mg total) by mouth 3 (three) times daily. 21 capsule 0    BP 127/72  Pulse 66  Temp 98 F (36.7 C)  Resp 20  Wt 155 lb (70.308 kg)  SpO2 97%  Physical Exam  Nursing note and vitals reviewed. Constitutional: She appears well-developed and well-nourished. No distress.  HENT:  Head: Normocephalic and atraumatic.  Eyes: Pupils are equal, round, and reactive to light.  Neck: Normal range of motion. Neck supple.  Cardiovascular: Normal rate and regular rhythm.   Pulmonary/Chest: Effort normal.  Abdominal: Soft.  Musculoskeletal:       Right hand: She exhibits tenderness. She exhibits normal range of motion, no bony tenderness, normal capillary refill and no swelling. normal sensation noted.        Hands:      One puncture wound to MIP of right ring finger. Not actively bleeding, no signs of infection appears to be superficial with minor puncture wound.  Neurological: She is alert.  Skin: Skin is warm and dry.    ED Course  Procedures (including critical care time)  Labs Reviewed - No data to display No results found.   1. Cat bite of finger       MDM  Wound thoroughly irrigated by myself with normal saline and IV catheter tip and syringe with 30ml normal saline.  Given Clinda PO 300mg  in ED and Rx for the same. Asked to follow-up in 48-72 hours or sooner if wound starts looking to be infected.  Pt has been advised of the symptoms that warrant their return to the ED. Patient has voiced understanding and has agreed to follow-up with the PCP or specialist.         Dorthula Matas, PA 05/13/12 2220

## 2012-05-13 NOTE — ED Notes (Signed)
Pt's cat bit her ring finger right hand; small puncture noted

## 2012-05-14 ENCOUNTER — Emergency Department (HOSPITAL_COMMUNITY)
Admission: EM | Admit: 2012-05-14 | Discharge: 2012-05-14 | Disposition: A | Discharge status: Home / Self Care | Payer: BC Managed Care – PPO | Attending: Emergency Medicine | Admitting: Emergency Medicine

## 2012-05-14 ENCOUNTER — Encounter (HOSPITAL_COMMUNITY): Payer: Self-pay | Admitting: Emergency Medicine

## 2012-05-14 DIAGNOSIS — M129 Arthropathy, unspecified: Secondary | ICD-10-CM | POA: Insufficient documentation

## 2012-05-14 DIAGNOSIS — M2540 Effusion, unspecified joint: Secondary | ICD-10-CM | POA: Insufficient documentation

## 2012-05-14 DIAGNOSIS — W5501XA Bitten by cat, initial encounter: Secondary | ICD-10-CM

## 2012-05-14 DIAGNOSIS — IMO0001 Reserved for inherently not codable concepts without codable children: Secondary | ICD-10-CM | POA: Insufficient documentation

## 2012-05-14 DIAGNOSIS — Y929 Unspecified place or not applicable: Secondary | ICD-10-CM | POA: Insufficient documentation

## 2012-05-14 DIAGNOSIS — S61209A Unspecified open wound of unspecified finger without damage to nail, initial encounter: Secondary | ICD-10-CM | POA: Insufficient documentation

## 2012-05-14 DIAGNOSIS — Y939 Activity, unspecified: Secondary | ICD-10-CM | POA: Insufficient documentation

## 2012-05-14 DIAGNOSIS — Z791 Long term (current) use of non-steroidal anti-inflammatories (NSAID): Secondary | ICD-10-CM | POA: Insufficient documentation

## 2012-05-14 MED ORDER — HYDROCODONE-ACETAMINOPHEN 5-500 MG PO TABS: 1.0000 | ORAL_TABLET | Freq: Four times a day (QID) | ORAL | Status: DC | PRN

## 2012-05-14 MED ORDER — CIPROFLOXACIN HCL 500 MG PO TABS: 500.0000 mg | ORAL_TABLET | Freq: Two times a day (BID) | ORAL | Status: DC

## 2012-05-14 MED ORDER — CIPROFLOXACIN HCL 500 MG PO TABS
500.0000 mg | ORAL_TABLET | Freq: Once | ORAL | Status: AC
  Administered 2012-05-14: 500 mg via ORAL
  Filled 2012-05-14: qty 1

## 2012-05-14 MED ORDER — NAPROXEN 500 MG PO TABS: 500.0000 mg | ORAL_TABLET | Freq: Two times a day (BID) | ORAL | Status: DC

## 2012-05-14 NOTE — ED Notes (Signed)
Pt states she was bitten by her cat last night  Pt was seen here last night for same  Pt states last night when they irrigated her finger it immediately started to swell  Pt states since then it has continued to swell   Pt states this am she woke with pain in that finger  Pt states she is concerned because it was never disinfected and only irrigated last night  Pt states she was discharged with script for clindamycin

## 2012-05-14 NOTE — ED Provider Notes (Signed)
Medical screening examination/treatment/procedure(s) were performed by non-physician practitioner and as supervising physician I was immediately available for consultation/collaboration.   Shasha Buchbinder M Faylene Allerton, MD 05/14/12 0040 

## 2012-05-14 NOTE — ED Provider Notes (Signed)
History     CSN: [id number]  Arrival date & time 05/14/12  0543   First MD Initiated Contact with Patient 05/14/12 [phone]      Chief Complaint  Patient presents with  . Animal Bite    (Consider location/radiation/quality/duration/timing/severity/associated sxs/prior treatment) HPI Comments: The patient presents approximately 10 hours after sustaining a cat bite puncture wound to her right hand, fourth digit, middle phalanx. She states this was acute in onset, but has been persistent, gradually worsening and radiating to the palm of her hand. She was seen in the emergency department last night, had the wound irrigated and was started on antibiotics states that the pain is still present and has gradually worsened along with swelling. She denies fevers, chills, nausea, vomiting.  Patient is a 61 y.o. female presenting with animal bite. The history is provided by the patient and medical records.  Animal Bite     Past Medical History  Diagnosis Date  . Arthritis   . Fibromyalgia     Past Surgical History  Procedure Date  . Abdominal hysterectomy   . Foot surgery     Family History  Problem Relation Age of Onset  . Cancer Mother   . Osteoarthritis Mother     History  Substance Use Topics  . Smoking status: Never Smoker   . Smokeless tobacco: Not on file  . Alcohol Use: No    OB History    Grav Para Term Preterm Abortions TAB SAB Ect Mult Living                  Review of Systems  Constitutional: Negative for fever and chills.  Musculoskeletal: Positive for joint swelling.  Skin: Negative for rash.    Allergies  Ace inhibitors; Aripiprazole; Cefixime; Codeine; Effexor; Escitalopram oxalate; Geodon; Lamictal; Lithium; Nortriptyline; Prozac; Sertraline hcl; Erythromycin; and Penicillins  Home Medications   Current Outpatient Rx  Name Route Sig Dispense Refill  . ACETAMINOPHEN 500 MG PO TABS Oral Take 1,000 mg by mouth every 6 (six) hours as needed. Pain    .  ALBUTEROL SULFATE HFA 108 (90 BASE) MCG/ACT IN AERS Inhalation Inhale 2 puffs into the lungs every 4 (four) hours as needed. Shortness of breath     . CALCIUM CARBONATE-VITAMIN D 500-200 MG-UNIT PO TABS Oral Take 1 tablet by mouth daily.     Marland Kitchen CLINDAMYCIN HCL 300 MG PO CAPS Oral Take 1 capsule (300 mg total) by mouth 3 (three) times daily. 21 capsule 0  . DICLOFENAC SODIUM 1 % TD GEL Topical Apply 1 application topically as needed. For shoulder pain.    . DULOXETINE HCL 30 MG PO CPEP Oral Take 30 mg by mouth 2 (two) times daily.     Marland Kitchen FLUTICASONE PROPIONATE 50 MCG/ACT NA SUSP Nasal Place 2 sprays into the nose daily.      Marland Kitchen GABAPENTIN 600 MG PO TABS Oral Take 1,200 mg by mouth 3 (three) times daily.    Carma Leaven M PLUS PO TABS Oral Take 1 tablet by mouth daily.      . TRAMADOL HCL 50 MG PO TABS Oral Take 50 mg by mouth every 6 (six) hours as needed. Maximum dose= 8 tablets per day- for pain     . VITAMIN C 500 MG PO TABS Oral Take 500 mg by mouth daily.      Marland Kitchen CIPROFLOXACIN HCL 500 MG PO TABS Oral Take 1 tablet (500 mg total) by mouth every 12 (twelve) hours. 20 tablet 0  .  EPINEPHRINE 0.3 MG/0.3ML IJ DEVI Intramuscular Inject 0.3 mg into the muscle once.      Marland Kitchen HYDROCODONE-ACETAMINOPHEN 5-500 MG PO TABS Oral Take 1-2 tablets by mouth every 6 (six) hours as needed for pain. 15 tablet 0  . NAPROXEN 500 MG PO TABS Oral Take 1 tablet (500 mg total) by mouth 2 (two) times daily with a meal. 30 tablet 0    BP 96/58  Pulse 80  Temp 97.9 F (36.6 C) (Oral)  Resp 18  SpO2 97%  Physical Exam  Constitutional: She appears well-developed and well-nourished. No distress.  HENT:  Head: Normocephalic and atraumatic.  Eyes: Conjunctivae normal are normal. No scleral icterus.  Musculoskeletal: She exhibits tenderness ( Tenderness present over the right fourth digit, volar surface, middle phalanx, palpable tenderness proximally to the proximal palm on the plantar surface).  Neurological: She is alert.  Coordination normal.       Sensation and motor intact to the right hand  Skin: Skin is warm and dry. No rash noted. She is not diaphoretic.       Mild swelling of the right fourth digit    ED Course  Procedures (including critical care time)  Labs Reviewed - No data to display No results found.   1. Cat bite       MDM  The patient presents with ongoing pain and slight increase in swelling and proximal extension of the pain after a cat bite sustained approximately 10 hours ago, has taken first dose of clindamycin, will discuss with hand surgeon regarding followup, she is afebrile and refusing pain medications at this time. Would likely benefit from gram-negative coverage with Cipro as well  Discussed with Dr. Magnus Ivan with hand surgery who agrees with adding ciprofloxacin, followup in 12-24 hours for recheck if not improved, patient appears stable, doubt significant ascending infection, again no fever no tachycardia and benign appearance.   Discharge Prescriptions include:  Percocet Naprosyn Ciprofloxacin      Vida Roller, MD 05/14/12 [phone]

## 2014-08-11 ENCOUNTER — Other Ambulatory Visit (HOSPITAL_COMMUNITY): Payer: Self-pay | Admitting: Rheumatology

## 2014-08-11 DIAGNOSIS — M810 Age-related osteoporosis without current pathological fracture: Secondary | ICD-10-CM

## 2014-08-16 ENCOUNTER — Ambulatory Visit (HOSPITAL_COMMUNITY)
Admission: RE | Admit: 2014-08-16 | Discharge: 2014-08-16 | Disposition: A | Discharge status: Home / Self Care | Payer: Medicare Other | Source: Ambulatory Visit | Attending: Rheumatology | Admitting: Rheumatology

## 2014-08-16 DIAGNOSIS — Z780 Asymptomatic menopausal state: Secondary | ICD-10-CM | POA: Diagnosis not present

## 2014-08-16 DIAGNOSIS — Z13820 Encounter for screening for osteoporosis: Secondary | ICD-10-CM | POA: Insufficient documentation

## 2014-08-16 DIAGNOSIS — M810 Age-related osteoporosis without current pathological fracture: Secondary | ICD-10-CM

## 2015-02-13 ENCOUNTER — Other Ambulatory Visit (HOSPITAL_COMMUNITY): Payer: Self-pay | Admitting: Rheumatology

## 2015-02-13 DIAGNOSIS — M25561 Pain in right knee: Secondary | ICD-10-CM

## 2015-02-26 ENCOUNTER — Ambulatory Visit (HOSPITAL_COMMUNITY)
Admission: RE | Admit: 2015-02-26 | Discharge: 2015-02-26 | Disposition: A | Discharge status: Home / Self Care | Payer: Medicare Other | Source: Ambulatory Visit | Attending: Rheumatology | Admitting: Rheumatology

## 2015-02-26 DIAGNOSIS — M25461 Effusion, right knee: Secondary | ICD-10-CM | POA: Diagnosis not present

## 2015-02-26 DIAGNOSIS — M25561 Pain in right knee: Secondary | ICD-10-CM | POA: Diagnosis present

## 2015-02-26 DIAGNOSIS — M6751 Plica syndrome, right knee: Secondary | ICD-10-CM | POA: Diagnosis not present

## 2015-02-27 ENCOUNTER — Other Ambulatory Visit (HOSPITAL_COMMUNITY): Payer: Medicare Other

## 2015-03-27 ENCOUNTER — Ambulatory Visit
Admission: RE | Admit: 2015-03-27 | Discharge: 2015-03-27 | Disposition: A | Discharge status: Home / Self Care | Payer: Medicare Other | Source: Ambulatory Visit | Attending: Family Medicine | Admitting: Family Medicine

## 2015-03-27 ENCOUNTER — Other Ambulatory Visit: Payer: Self-pay | Admitting: Family Medicine

## 2015-03-27 DIAGNOSIS — R05 Cough: Secondary | ICD-10-CM

## 2015-03-27 DIAGNOSIS — R059 Cough, unspecified: Secondary | ICD-10-CM

## 2015-04-30 ENCOUNTER — Encounter (HOSPITAL_COMMUNITY)
Admission: RE | Admit: 2015-04-30 | Discharge: 2015-04-30 | Disposition: A | Discharge status: Home / Self Care | Payer: Medicare Other | Source: Ambulatory Visit | Attending: Orthopaedic Surgery | Admitting: Orthopaedic Surgery

## 2015-04-30 ENCOUNTER — Encounter (HOSPITAL_COMMUNITY): Payer: Self-pay

## 2015-04-30 DIAGNOSIS — Z01812 Encounter for preprocedural laboratory examination: Secondary | ICD-10-CM | POA: Diagnosis not present

## 2015-04-30 DIAGNOSIS — Z01818 Encounter for other preprocedural examination: Secondary | ICD-10-CM | POA: Insufficient documentation

## 2015-04-30 DIAGNOSIS — M1711 Unilateral primary osteoarthritis, right knee: Secondary | ICD-10-CM | POA: Insufficient documentation

## 2015-04-30 DIAGNOSIS — R9431 Abnormal electrocardiogram [ECG] [EKG]: Secondary | ICD-10-CM | POA: Diagnosis not present

## 2015-04-30 DIAGNOSIS — Z0183 Encounter for blood typing: Secondary | ICD-10-CM | POA: Diagnosis not present

## 2015-04-30 HISTORY — DX: Personal history of other infectious and parasitic diseases: Z86.19

## 2015-04-30 HISTORY — DX: Frequency of micturition: R35.0

## 2015-04-30 HISTORY — DX: Other specified disorders of bone density and structure, unspecified site: M85.80

## 2015-04-30 HISTORY — DX: Other specified postprocedural states: Z98.890

## 2015-04-30 HISTORY — DX: Effusion, unspecified joint: M25.40

## 2015-04-30 HISTORY — DX: Other skin changes: R23.8

## 2015-04-30 HISTORY — DX: Other chronic pain: G89.29

## 2015-04-30 HISTORY — DX: Insomnia, unspecified: G47.00

## 2015-04-30 HISTORY — DX: Unspecified osteoarthritis, unspecified site: M19.90

## 2015-04-30 HISTORY — DX: Panic disorder (episodic paroxysmal anxiety): F41.0

## 2015-04-30 HISTORY — DX: Nausea with vomiting, unspecified: R11.2

## 2015-04-30 HISTORY — DX: Gastro-esophageal reflux disease without esophagitis: K21.9

## 2015-04-30 HISTORY — DX: Major depressive disorder, single episode, unspecified: F32.9

## 2015-04-30 HISTORY — DX: Dizziness and giddiness: R42

## 2015-04-30 HISTORY — DX: Pain in unspecified joint: M25.50

## 2015-04-30 HISTORY — DX: Unspecified cataract: H26.9

## 2015-04-30 HISTORY — DX: Family history of other specified conditions: Z84.89

## 2015-04-30 HISTORY — DX: Unspecified glaucoma: H40.9

## 2015-04-30 HISTORY — DX: Depression, unspecified: F32.A

## 2015-04-30 HISTORY — DX: Anxiety disorder, unspecified: F41.9

## 2015-04-30 HISTORY — DX: Personal history of other diseases of the respiratory system: Z87.09

## 2015-04-30 HISTORY — DX: Dorsalgia, unspecified: M54.9

## 2015-04-30 HISTORY — DX: Irritable bowel syndrome without diarrhea: K58.9

## 2015-04-30 HISTORY — DX: Urgency of urination: R39.15

## 2015-04-30 HISTORY — DX: Weakness: R53.1

## 2015-04-30 HISTORY — DX: Xerosis cutis: L85.3

## 2015-04-30 LAB — COMPREHENSIVE METABOLIC PANEL
ALBUMIN: 3.9 g/dL (ref 3.5–5.0)
ALK PHOS: 74 U/L (ref 38–126)
ALT: 33 U/L (ref 14–54)
AST: 32 U/L (ref 15–41)
Anion gap: 8 (ref 5–15)
BUN: 14 mg/dL (ref 6–20)
CALCIUM: 9.4 mg/dL (ref 8.9–10.3)
CHLORIDE: 102 mmol/L (ref 101–111)
CO2: 29 mmol/L (ref 22–32)
CREATININE: 0.87 mg/dL (ref 0.44–1.00)
GFR calc Af Amer: >60 mL/min (ref >60)
GFR calc non Af Amer: >60 mL/min (ref >60)
GLUCOSE: 103 mg/dL — AB (ref 65–99)
Potassium: 5 mmol/L (ref 3.5–5.1)
SODIUM: 139 mmol/L (ref 135–145)
Total Bilirubin: 0.6 mg/dL (ref 0.3–1.2)
Total Protein: 7.6 g/dL (ref 6.5–8.1)

## 2015-04-30 LAB — URINALYSIS, ROUTINE W REFLEX MICROSCOPIC
Bilirubin Urine: NEGATIVE
GLUCOSE, UA: NEGATIVE mg/dL
Hgb urine dipstick: NEGATIVE
Ketones, ur: NEGATIVE mg/dL
Nitrite: NEGATIVE
Protein, ur: NEGATIVE mg/dL
Specific Gravity, Urine: 1.014 (ref 1.005–1.030)
UROBILINOGEN UA: 0.2 mg/dL (ref 0.0–1.0)
pH: 6.5 (ref 5.0–8.0)

## 2015-04-30 LAB — SURGICAL PCR SCREEN
MRSA, PCR: NEGATIVE
Staphylococcus aureus: POSITIVE — AB

## 2015-04-30 LAB — ABO/RH: ABO/RH(D): B POS

## 2015-04-30 LAB — CBC WITH DIFFERENTIAL/PLATELET
BASOS ABS: 0 10*3/uL (ref 0.0–0.1)
BASOS PCT: 1 %
EOS ABS: 0.1 10*3/uL (ref 0.0–0.7)
EOS PCT: 2 %
HCT: 41.9 % (ref 36.0–46.0)
HEMOGLOBIN: 13.9 g/dL (ref 12.0–15.0)
LYMPHS ABS: 1.7 10*3/uL (ref 0.7–4.0)
Lymphocytes Relative: 36 %
MCH: 31.2 pg (ref 26.0–34.0)
MCHC: 33.2 g/dL (ref 30.0–36.0)
MCV: 94.2 fL (ref 78.0–100.0)
Monocytes Absolute: 0.4 10*3/uL (ref 0.1–1.0)
Monocytes Relative: 7 %
NEUTROS PCT: 54 %
Neutro Abs: 2.6 10*3/uL (ref 1.7–7.7)
PLATELETS: 296 10*3/uL (ref 150–400)
RBC: 4.45 MIL/uL (ref 3.87–5.11)
RDW: 14.3 % (ref 11.5–15.5)
WBC: 4.8 10*3/uL (ref 4.0–10.5)

## 2015-04-30 LAB — PROTIME-INR
INR: 1.01 (ref 0.00–1.49)
Prothrombin Time: 13.5 seconds (ref 11.6–15.2)

## 2015-04-30 LAB — TYPE AND SCREEN
ABO/RH(D): B POS
Antibody Screen: NEGATIVE

## 2015-04-30 LAB — URINE MICROSCOPIC-ADD ON

## 2015-04-30 LAB — APTT: APTT: 27 s (ref 24–37)

## 2015-04-30 MED ORDER — CHLORHEXIDINE GLUCONATE 4 % EX LIQD: 60.0000 mL | Freq: Once | CUTANEOUS | Status: DC

## 2015-04-30 NOTE — Progress Notes (Addendum)
Cardiologist denies having one  Medical Md is Dr.[name] Vernell Leep denies ever having one  Stress test denies ever having one  Heart cath denies ever having one  EKG denies having one in the past yr  CXR in epic from 03-27-15

## 2015-04-30 NOTE — Progress Notes (Signed)
Mupirocin script called into the [facility] on [address]

## 2015-04-30 NOTE — Pre-Procedure Instructions (Signed)
[name]  04/30/2015      [facility] - [place], [place] [address] Phone: [phone] Fax: [phone]  [facility] [place], [place] - [place] [address] [facility] [place] [address] Phone: [phone] Fax: [phone]    Your procedure is scheduled on Tues, Oct 25 @ 7:15 AM  Report to [facility] Admitting at 5:30 AM  Call this number if you have problems the morning of surgery:  [phone]   Remember:  Do not eat food or drink liquids after midnight.  Take these medicines the morning of surgery with A SIP OF WATER Albuterol<Bring Your Inhaler With You>,Cymbalta(Duloxetine),Flonase(Fluticasone),Gabapentin(Neurontin),and Tramadol(Ultram-if needed)              Stop using the Diclofenac gel.Also stop taking any Vitamins or Herbal Medications. No Goody's,BC's,Aleve,Aspirin,Ibuprofen,or Fish Oil.   Do not wear jewelry, make-up or nail polish.  Do not wear lotions, powders, or perfumes.  You may wear deodorant.  Do not shave 48 hours prior to surgery.    Do not bring valuables to the hospital.  [facility] is not responsible for any belongings or valuables.  Contacts, dentures or bridgework may not be worn into surgery.  Leave your suitcase in the car.  After surgery it may be brought to your room.  For patients admitted to the hospital, discharge time will be determined by your treatment team.  Patients discharged the day of surgery will not be allowed to drive home.    Special instructions:  [facility] - Preparing for Surgery  Before surgery, you can play an important role.  Because skin is not sterile, your skin needs to be as free of germs as possible.  You can reduce the number of germs on you skin by washing with CHG (chlorahexidine gluconate) soap before surgery.  CHG is an antiseptic cleaner which kills germs and bonds with the skin  to continue killing germs even after washing.  Please DO NOT use if you have an allergy to CHG or antibacterial soaps.  If your skin becomes reddened/irritated stop using the CHG and inform your nurse when you arrive at Short Stay.  Do not shave (including legs and underarms) for at least 48 hours prior to the first CHG shower.  You may shave your face.  Please follow these instructions carefully:   1.  Shower with CHG Soap the night before surgery and the                                morning of Surgery.  2.  If you choose to wash your hair, wash your hair first as usual with your       normal shampoo.  3.  After you shampoo, rinse your hair and body thoroughly to remove the                      Shampoo.  4.  Use CHG as you would any other liquid soap.  You can apply chg directly       to the skin and wash gently with scrungie or a clean washcloth.  5.  Apply the CHG Soap to your body ONLY FROM THE NECK DOWN.        Do not use on open wounds or open sores.  Avoid  contact with your eyes,       ears, mouth and genitals (private parts).  Wash genitals (private parts)       with your normal soap.  6.  Wash thoroughly, paying special attention to the area where your surgery        will be performed.  7.  Thoroughly rinse your body with warm water from the neck down.  8.  DO NOT shower/wash with your normal soap after using and rinsing off       the CHG Soap.  9.  Pat yourself dry with a clean towel.            10.  Wear clean pajamas.            11.  Place clean sheets on your bed the night of your first shower and do not        sleep with pets.  Day of Surgery  Do not apply any lotions/deoderants the morning of surgery.  Please wear clean clothes to the hospital/surgery center.    Please read over the following fact sheets that you were given. Pain Booklet, Coughing and Deep Breathing, Blood Transfusion Information, MRSA Information and Surgical Site Infection Prevention

## 2015-05-01 LAB — URINE CULTURE

## 2015-05-01 NOTE — H&P (Signed)
CHIEF COMPLAINT:  Painful right knee.    HISTORY OF PRESENT ILLNESS:  [name] is a very pleasant 64 year old white female who is seen today for evaluation of her right knee.  She is a patient of Dr. Estanislado Pandy, rheumatologist, with a history of fibromyalgia, as well as osteoarthritis of the right knee.  She did have an MRI scan performed recently, which did reveal a posterior horn tear of the medial meniscus, as well as full-thickness cartilage loss in both the medial and lateral joints as well as patellofemoral joint.  This was discussed with her.  The different types of treatments, those of more conservative, that of knee arthroscopy to remove the medial meniscus, versus total knee replacement.  She had thought about this and was concerned of the fact that if by doing a limited procedure such as an arthroscopic debridement that it would probably not be as beneficial.  Therefore, she is leaning toward having total knee replacement.  She apparently did have a knee injury back in February 2016 when she had an injury with a twisting motion.  She had tried ice and anti-inflammatories, but continued to have right knee pain more on the lateral aspect.  She had difficulty with her gait and she did have a corticosteroid injection, but it was not beneficial.  That is the reason for the MRI scan.  She has feelings of giving way, but has not exactly locked at this time.  Certainly she does complain of crepitus as she is going up and down stairs.  Again, she has failed corticosteroid injections.  Seen today for re-evaluation.     PAST MEDICAL HISTORY:  In general, her health is good.    PAST SURGICAL HISTORY:  Hospitalizations and surgeries: 1.  November 2010 for right leg and foot surgery by Dr. Doran Durand.   2.  May 2007 for vaginal total hysterectomy and bladder sling procedure.   MEDICATIONS: 1.    Alendronate 70 mg once a week. 2.    Cymbalta 30 mg daily.  3.    EpiPen p.r.n.  4.    Fluticasone propionate 50 mcg 2  puffs b.i.d.  5.    Gabapentin 300 mg tablets 2 tablets t.i.d.  6.    Tramadol hydrochloride 50 mg p.r.n.  7.    Voltaren gel 1% p.r.n.  8.    Carisoprodol 350 mg 1 at bedtime p.r.n.  9.    Robaxin 500 mg 1 tablet twice a day p.r.n.  10.  Montelukast sodium 10 mg tablet at bedtime p.r.n.  11.  Aleve 220 mg b.i.d.    ALLERGIES:  Latex and penicillin, as well as Celebrex.  She also has difficulty with codeine and the codeine derivatives, oxycodone and hydrocodone, with nausea and vomiting.     REVIEW OF SYSTEMS:  A 14-point review of systems is positive for cataracts.  She did have a lot of problems with urinary type situation where she had frequent urination.  She did have interstitial cystitis, which is in remission.  She had previously migraines, but nothing since.  She does have a history of asthma and is on appropriate medicines for this.  She does bruise easily and does have nosebleeds.  She does have history of depression after her son had taken his own life.  She feels it is posttraumatic stress disorder.     FAMILY HISTORY:  Positive for mother who had heart attack and arthritis.  Father is unremarkable.    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  She denies the use of tobacco or alcohol.     PHYSICAL EXAMINATION:  Exam today reveals a 64 year old white female, well developed, well nourished, alert, pleasant, cooperative, in moderate distress secondary to right knee pain.  Height is 5 feet 2 inches, weight 209 pounds.  BMI is 38.2.   Vital signs:  temperature 97.6, pulse 76, respirations 16, blood pressure 138/80.   Head:  Normocephalic.  Eyes:  Pupils equal, round and reactive to light and accommodation with extraocular movements intact.  Ears, nose and throat:  Benign. Neck:  Supple.  No carotid bruits.  Chest:  Good expansion.   Lungs:  Essentially clear. Cardiac:   Regular rhythm and rate.  Normal S1, S2.  No  murmurs were noted.     Pulses:  1+ bilateral and symmetric in the lower extremities. Abdomen:  Obese.  Soft, nontender.  No masses palpable.  Normal bowel sounds present.   CNS:  She is oriented x3.  Cranial nerves II-XII grossly intact.   Genitorectal/breast:  Exam not indicated for an orthopedic evaluation.   Musculoskeletal:  She has range of motion from about 3-4 degrees shy of full extension to about 120 degrees.  Tender to palpation over the medial joint line.  Trace effusion.  She does have pseudolaxity of the knee with varus and valgus stressing.   Skin:  Intact.      RADIOGRAPHS:  Studies reveal advanced degenerative changes in both medial and lateral joint spaces.  She has periarticular spurring medial and lateral.  Sclerosing more medial.  This was a straight AP standing film.  She does have patellofemoral OA noted on the sunrise as well as the lateral.  Fairly significant patellofemoral changes with spurring noted.     CLINICAL IMPRESSION:   1.  End-stage OA of the right knee. 2.  Asthma. 3.  Posttraumatic stress disorder.  4.  History of interstitial cystitis.    RECOMMENDATIONS:  At this time, I have reviewed a medical clearance form from Dr. Alroy Dust, who feels that she is a candidate for total knee replacement.  A chest x-ray of 03/27/2015 did not reveal any active cardiopulmonary disease.  Therefore, our plan is to proceed with a right total knee arthroplasty.  Procedure risks and benefits were fully explained to her using appropriate models.  She is understanding.  She will plan on proceeding with this in the near future.     Spoke with Dr Virgilio Belling nurse and he reviewed [facility] and they were very similar and he felt no need for cardiac evaluation  Mike Craze. [place], [place] [phone]  05/01/2015 2:14 PM

## 2015-05-07 MED ORDER — ACETAMINOPHEN 10 MG/ML IV SOLN
1000.0000 mg | INTRAVENOUS | Status: AC
  Administered 2015-05-08: 1000 mg via INTRAVENOUS
  Filled 2015-05-07: qty 100

## 2015-05-07 MED ORDER — VANCOMYCIN HCL IN DEXTROSE 1-5 GM/200ML-% IV SOLN
1000.0000 mg | INTRAVENOUS | Status: AC
  Administered 2015-05-08: 1000 mg via INTRAVENOUS
  Filled 2015-05-07: qty 200

## 2015-05-07 MED ORDER — SODIUM CHLORIDE 0.9 % IV SOLN: INTRAVENOUS | Status: DC

## 2015-05-08 ENCOUNTER — Inpatient Hospital Stay (HOSPITAL_COMMUNITY): Payer: Medicare Other | Admitting: Anesthesiology

## 2015-05-08 ENCOUNTER — Encounter (HOSPITAL_COMMUNITY): Payer: Self-pay | Admitting: *Deleted

## 2015-05-08 ENCOUNTER — Inpatient Hospital Stay (HOSPITAL_COMMUNITY)
Admission: RE | Admit: 2015-05-08 | Discharge: 2015-05-10 | DRG: 470 | Disposition: A | Discharge status: Home Health | Payer: Medicare Other | Source: Ambulatory Visit | Attending: Orthopaedic Surgery | Admitting: Orthopaedic Surgery

## 2015-05-08 ENCOUNTER — Inpatient Hospital Stay (HOSPITAL_COMMUNITY): Payer: Medicare Other | Admitting: Emergency Medicine

## 2015-05-08 ENCOUNTER — Encounter (HOSPITAL_COMMUNITY)
Admission: RE | Disposition: A | Discharge status: Home Health | Payer: Self-pay | Source: Ambulatory Visit | Attending: Orthopaedic Surgery

## 2015-05-08 DIAGNOSIS — K219 Gastro-esophageal reflux disease without esophagitis: Secondary | ICD-10-CM | POA: Diagnosis present

## 2015-05-08 DIAGNOSIS — F4310 Post-traumatic stress disorder, unspecified: Secondary | ICD-10-CM | POA: Diagnosis present

## 2015-05-08 DIAGNOSIS — Z6837 Body mass index (BMI) 37.0-37.9, adult: Secondary | ICD-10-CM | POA: Diagnosis not present

## 2015-05-08 DIAGNOSIS — F329 Major depressive disorder, single episode, unspecified: Secondary | ICD-10-CM | POA: Diagnosis present

## 2015-05-08 DIAGNOSIS — M8580 Other specified disorders of bone density and structure, unspecified site: Secondary | ICD-10-CM | POA: Diagnosis present

## 2015-05-08 DIAGNOSIS — Z8249 Family history of ischemic heart disease and other diseases of the circulatory system: Secondary | ICD-10-CM

## 2015-05-08 DIAGNOSIS — F32A Depression, unspecified: Secondary | ICD-10-CM | POA: Diagnosis present

## 2015-05-08 DIAGNOSIS — S83249A Other tear of medial meniscus, current injury, unspecified knee, initial encounter: Secondary | ICD-10-CM | POA: Diagnosis present

## 2015-05-08 DIAGNOSIS — E669 Obesity, unspecified: Secondary | ICD-10-CM | POA: Diagnosis present

## 2015-05-08 DIAGNOSIS — J45909 Unspecified asthma, uncomplicated: Secondary | ICD-10-CM | POA: Diagnosis present

## 2015-05-08 DIAGNOSIS — M797 Fibromyalgia: Secondary | ICD-10-CM | POA: Diagnosis present

## 2015-05-08 DIAGNOSIS — M25561 Pain in right knee: Secondary | ICD-10-CM | POA: Diagnosis present

## 2015-05-08 DIAGNOSIS — M1711 Unilateral primary osteoarthritis, right knee: Principal | ICD-10-CM | POA: Diagnosis present

## 2015-05-08 DIAGNOSIS — M1710 Unilateral primary osteoarthritis, unspecified knee: Secondary | ICD-10-CM | POA: Diagnosis present

## 2015-05-08 HISTORY — PX: TOTAL KNEE ARTHROPLASTY: SHX125

## 2015-05-08 SURGERY — ARTHROPLASTY, KNEE, TOTAL
Anesthesia: Monitor Anesthesia Care | Site: Knee | Laterality: Right

## 2015-05-08 MED ORDER — B COMPLEX-C PO TABS
1.0000 | ORAL_TABLET | Freq: Every day | ORAL | Status: DC
  Administered 2015-05-09 – 2015-05-10 (×2): 1 via ORAL
  Filled 2015-05-08 (×3): qty 1

## 2015-05-08 MED ORDER — DIPHENHYDRAMINE HCL 12.5 MG/5ML PO ELIX: 12.5000 mg | ORAL_SOLUTION | ORAL | Status: DC | PRN

## 2015-05-08 MED ORDER — METHOCARBAMOL 500 MG PO TABS
500.0000 mg | ORAL_TABLET | Freq: Four times a day (QID) | ORAL | Status: DC | PRN
Start: 2015-05-08 — End: 2015-05-10
  Administered 2015-05-08 – 2015-05-10 (×5): 500 mg via ORAL
  Filled 2015-05-08 (×6): qty 1

## 2015-05-08 MED ORDER — DEXTROSE 5 % IV SOLN
500.0000 mg | Freq: Four times a day (QID) | INTRAVENOUS | Status: DC | PRN
  Administered 2015-05-08: 500 mg via INTRAVENOUS
  Filled 2015-05-08 (×3): qty 5

## 2015-05-08 MED ORDER — ALBUTEROL SULFATE (2.5 MG/3ML) 0.083% IN NEBU: 3.0000 mL | INHALATION_SOLUTION | RESPIRATORY_TRACT | Status: DC | PRN

## 2015-05-08 MED ORDER — ONDANSETRON HCL 4 MG/2ML IJ SOLN: 4.0000 mg | Freq: Once | INTRAMUSCULAR | Status: DC | PRN

## 2015-05-08 MED ORDER — ONDANSETRON HCL 4 MG PO TABS: 4.0000 mg | ORAL_TABLET | Freq: Four times a day (QID) | ORAL | Status: DC | PRN

## 2015-05-08 MED ORDER — MONTELUKAST SODIUM 10 MG PO TABS
10.0000 mg | ORAL_TABLET | Freq: Every day | ORAL | Status: DC
  Administered 2015-05-08 – 2015-05-09 (×2): 10 mg via ORAL
  Filled 2015-05-08 (×2): qty 1

## 2015-05-08 MED ORDER — MAGNESIUM CITRATE PO SOLN: 1.0000 | Freq: Once | ORAL | Status: DC | PRN

## 2015-05-08 MED ORDER — RIVAROXABAN 10 MG PO TABS
10.0000 mg | ORAL_TABLET | Freq: Every day | ORAL | Status: DC
  Administered 2015-05-09 – 2015-05-10 (×2): 10 mg via ORAL
  Filled 2015-05-08 (×2): qty 1

## 2015-05-08 MED ORDER — SODIUM CHLORIDE 0.9 % IV SOLN
INTRAVENOUS | Status: DC
  Administered 2015-05-08: 22:00:00 via INTRAVENOUS
  Administered 2015-05-08: 75 mL/h via INTRAVENOUS

## 2015-05-08 MED ORDER — FENTANYL CITRATE (PF) 100 MCG/2ML IJ SOLN
25.0000 ug | INTRAMUSCULAR | Status: DC | PRN
  Administered 2015-05-08 (×3): 50 ug via INTRAVENOUS

## 2015-05-08 MED ORDER — VITAMIN D 1000 UNITS PO TABS
1000.0000 [IU] | ORAL_TABLET | Freq: Every day | ORAL | Status: DC
  Administered 2015-05-08 – 2015-05-10 (×3): 1000 [IU] via ORAL
  Filled 2015-05-08 (×3): qty 1

## 2015-05-08 MED ORDER — METOCLOPRAMIDE HCL 5 MG PO TABS: 5.0000 mg | ORAL_TABLET | Freq: Three times a day (TID) | ORAL | Status: DC | PRN

## 2015-05-08 MED ORDER — FENTANYL CITRATE (PF) 250 MCG/5ML IJ SOLN
INTRAMUSCULAR | Status: AC
  Filled 2015-05-08: qty 5

## 2015-05-08 MED ORDER — POLYETHYLENE GLYCOL 3350 17 G PO PACK: 17.0000 g | PACK | Freq: Every day | ORAL | Status: DC | PRN

## 2015-05-08 MED ORDER — PROPOFOL 10 MG/ML IV BOLUS
INTRAVENOUS | Status: AC
  Filled 2015-05-08: qty 20

## 2015-05-08 MED ORDER — EPINEPHRINE 0.3 MG/0.3ML IJ DEVI: 0.3000 mg | Freq: Once | INTRAMUSCULAR | Status: DC

## 2015-05-08 MED ORDER — VANCOMYCIN HCL IN DEXTROSE 1-5 GM/200ML-% IV SOLN
1000.0000 mg | Freq: Two times a day (BID) | INTRAVENOUS | Status: AC
  Administered 2015-05-08: 1000 mg via INTRAVENOUS
  Filled 2015-05-08 (×2): qty 200

## 2015-05-08 MED ORDER — ONDANSETRON HCL 4 MG/2ML IJ SOLN
4.0000 mg | Freq: Four times a day (QID) | INTRAMUSCULAR | Status: DC | PRN
  Administered 2015-05-09: 4 mg via INTRAVENOUS
  Filled 2015-05-08: qty 2

## 2015-05-08 MED ORDER — SODIUM CHLORIDE 0.9 % IR SOLN
Status: DC | PRN
  Administered 2015-05-08: 1000 mL

## 2015-05-08 MED ORDER — TRANEXAMIC ACID 1000 MG/10ML IV SOLN
2000.0000 mg | INTRAVENOUS | Status: AC
  Administered 2015-05-08: 2000 mg via TOPICAL
  Filled 2015-05-08: qty 20

## 2015-05-08 MED ORDER — TRANEXAMIC ACID 1000 MG/10ML IV SOLN
1000.0000 mg | INTRAVENOUS | Status: DC
  Filled 2015-05-08: qty 10

## 2015-05-08 MED ORDER — PROPOFOL 500 MG/50ML IV EMUL
INTRAVENOUS | Status: DC | PRN
  Administered 2015-05-08: 50 ug/kg/min via INTRAVENOUS

## 2015-05-08 MED ORDER — PHENOL 1.4 % MT LIQD: 1.0000 | OROMUCOSAL | Status: DC | PRN

## 2015-05-08 MED ORDER — MENTHOL 3 MG MT LOZG: 1.0000 | LOZENGE | OROMUCOSAL | Status: DC | PRN

## 2015-05-08 MED ORDER — ONDANSETRON HCL 4 MG/2ML IJ SOLN
INTRAMUSCULAR | Status: DC | PRN
  Administered 2015-05-08: 4 mg via INTRAVENOUS

## 2015-05-08 MED ORDER — DULOXETINE HCL 30 MG PO CPEP
30.0000 mg | ORAL_CAPSULE | Freq: Every day | ORAL | Status: DC
  Administered 2015-05-09 – 2015-05-10 (×2): 30 mg via ORAL
  Filled 2015-05-08 (×2): qty 1

## 2015-05-08 MED ORDER — KETOROLAC TROMETHAMINE 15 MG/ML IJ SOLN
7.5000 mg | Freq: Four times a day (QID) | INTRAMUSCULAR | Status: AC
  Administered 2015-05-08 – 2015-05-09 (×4): 7.5 mg via INTRAVENOUS
  Filled 2015-05-08 (×3): qty 1

## 2015-05-08 MED ORDER — BUPIVACAINE-EPINEPHRINE 0.25% -1:200000 IJ SOLN
INTRAMUSCULAR | Status: DC | PRN
  Administered 2015-05-08: 30 mL

## 2015-05-08 MED ORDER — B COMPLEX PO TABS: 1.0000 | ORAL_TABLET | Freq: Every day | ORAL | Status: DC

## 2015-05-08 MED ORDER — ACETAMINOPHEN 10 MG/ML IV SOLN
1000.0000 mg | Freq: Four times a day (QID) | INTRAVENOUS | Status: AC
  Administered 2015-05-08 – 2015-05-09 (×4): 1000 mg via INTRAVENOUS
  Filled 2015-05-08 (×4): qty 100

## 2015-05-08 MED ORDER — FLUTICASONE PROPIONATE 50 MCG/ACT NA SUSP
2.0000 | Freq: Every day | NASAL | Status: DC
  Administered 2015-05-09 – 2015-05-10 (×2): 2 via NASAL
  Filled 2015-05-08: qty 16

## 2015-05-08 MED ORDER — LACTATED RINGERS IV SOLN
INTRAVENOUS | Status: DC | PRN
  Administered 2015-05-08 (×2): via INTRAVENOUS

## 2015-05-08 MED ORDER — KETOROLAC TROMETHAMINE 15 MG/ML IJ SOLN
INTRAMUSCULAR | Status: AC
  Filled 2015-05-08: qty 1

## 2015-05-08 MED ORDER — CALCIUM CARBONATE-VITAMIN D 500-200 MG-UNIT PO TABS
1.0000 | ORAL_TABLET | Freq: Every day | ORAL | Status: DC
  Administered 2015-05-08 – 2015-05-10 (×3): 1 via ORAL
  Filled 2015-05-08 (×3): qty 1

## 2015-05-08 MED ORDER — MIDAZOLAM HCL 2 MG/2ML IJ SOLN
INTRAMUSCULAR | Status: AC
  Filled 2015-05-08: qty 4

## 2015-05-08 MED ORDER — BUPIVACAINE-EPINEPHRINE (PF) 0.25% -1:200000 IJ SOLN
INTRAMUSCULAR | Status: AC
  Filled 2015-05-08: qty 30

## 2015-05-08 MED ORDER — PHENYLEPHRINE HCL 10 MG/ML IJ SOLN
10.0000 mg | INTRAVENOUS | Status: DC | PRN
  Administered 2015-05-08: 60 ug/min via INTRAVENOUS
  Administered 2015-05-08: 40 ug/min via INTRAVENOUS

## 2015-05-08 MED ORDER — GABAPENTIN 300 MG PO CAPS
600.0000 mg | ORAL_CAPSULE | Freq: Three times a day (TID) | ORAL | Status: DC
  Administered 2015-05-08 – 2015-05-10 (×6): 600 mg via ORAL
  Filled 2015-05-08 (×6): qty 2

## 2015-05-08 MED ORDER — FENTANYL CITRATE (PF) 100 MCG/2ML IJ SOLN
INTRAMUSCULAR | Status: AC
  Filled 2015-05-08: qty 2

## 2015-05-08 MED ORDER — TRAMADOL HCL 50 MG PO TABS
50.0000 mg | ORAL_TABLET | Freq: Four times a day (QID) | ORAL | Status: DC | PRN
  Administered 2015-05-08: 50 mg via ORAL
  Administered 2015-05-09: 100 mg via ORAL
  Filled 2015-05-08: qty 1
  Filled 2015-05-08: qty 2

## 2015-05-08 MED ORDER — METOCLOPRAMIDE HCL 5 MG/ML IJ SOLN: 5.0000 mg | Freq: Three times a day (TID) | INTRAMUSCULAR | Status: DC | PRN

## 2015-05-08 MED ORDER — BISACODYL 10 MG RE SUPP: 10.0000 mg | Freq: Every day | RECTAL | Status: DC | PRN

## 2015-05-08 MED ORDER — HYDROMORPHONE HCL 1 MG/ML IJ SOLN
0.5000 mg | INTRAMUSCULAR | Status: DC | PRN
  Administered 2015-05-08 – 2015-05-10 (×7): 1 mg via INTRAVENOUS
  Filled 2015-05-08 (×8): qty 1

## 2015-05-08 SURGICAL SUPPLY — 58 items
BANDAGE ESMARK 6X9 LF (GAUZE/BANDAGES/DRESSINGS) ×1 IMPLANT
BLADE SAGITTAL 25.0X1.19X90 (BLADE) ×2 IMPLANT
BNDG CMPR 9X6 STRL LF SNTH (GAUZE/BANDAGES/DRESSINGS) ×1
BNDG ESMARK 6X9 LF (GAUZE/BANDAGES/DRESSINGS) ×2
BOWL SMART MIX CTS (DISPOSABLE) ×2 IMPLANT
CAP KNEE TOTAL 3 SIGMA ×1 IMPLANT
CEMENT HV SMART SET (Cement) ×4 IMPLANT
COVER SURGICAL LIGHT HANDLE (MISCELLANEOUS) ×2 IMPLANT
CUFF TOURNIQUET SINGLE 34IN LL (TOURNIQUET CUFF) ×1 IMPLANT
CUFF TOURNIQUET SINGLE 44IN (TOURNIQUET CUFF) IMPLANT
DRAPE EXTREMITY T 121X128X90 (DRAPE) ×2 IMPLANT
DRAPE PROXIMA HALF (DRAPES) ×2 IMPLANT
DRSG ADAPTIC 3X8 NADH LF (GAUZE/BANDAGES/DRESSINGS) ×2 IMPLANT
DRSG PAD ABDOMINAL 8X10 ST (GAUZE/BANDAGES/DRESSINGS) ×3 IMPLANT
DURAPREP 26ML APPLICATOR (WOUND CARE) ×4 IMPLANT
ELECT CAUTERY BLADE 6.4 (BLADE) ×2 IMPLANT
ELECT REM PT RETURN 9FT ADLT (ELECTROSURGICAL) ×2
ELECTRODE REM PT RTRN 9FT ADLT (ELECTROSURGICAL) ×1 IMPLANT
EVACUATOR 1/8 PVC DRAIN (DRAIN) IMPLANT
FACESHIELD WRAPAROUND (MASK) ×4 IMPLANT
FACESHIELD WRAPAROUND OR TEAM (MASK) ×2 IMPLANT
GAUZE SPONGE 4X4 12PLY STRL (GAUZE/BANDAGES/DRESSINGS) ×2 IMPLANT
GLOVE BIOGEL PI IND STRL 8 (GLOVE) ×1 IMPLANT
GLOVE BIOGEL PI IND STRL 8.5 (GLOVE) ×1 IMPLANT
GLOVE BIOGEL PI INDICATOR 8 (GLOVE) ×1
GLOVE BIOGEL PI INDICATOR 8.5 (GLOVE) ×1
GLOVE ECLIPSE 8.0 STRL XLNG CF (GLOVE) ×4 IMPLANT
GLOVE SURG ORTHO 8.5 STRL (GLOVE) ×4 IMPLANT
GOWN STRL REUS W/ TWL LRG LVL3 (GOWN DISPOSABLE) ×2 IMPLANT
GOWN STRL REUS W/TWL 2XL LVL3 (GOWN DISPOSABLE) ×2 IMPLANT
GOWN STRL REUS W/TWL LRG LVL3 (GOWN DISPOSABLE) ×4
HANDPIECE INTERPULSE COAX TIP (DISPOSABLE) ×2
KIT BASIN OR (CUSTOM PROCEDURE TRAY) ×2 IMPLANT
KIT ROOM TURNOVER OR (KITS) ×2 IMPLANT
MANIFOLD NEPTUNE II (INSTRUMENTS) ×2 IMPLANT
NEEDLE 22X1 1/2 (OR ONLY) (NEEDLE) ×2 IMPLANT
NS IRRIG 1000ML POUR BTL (IV SOLUTION) ×2 IMPLANT
PACK TOTAL JOINT (CUSTOM PROCEDURE TRAY) ×2 IMPLANT
PACK UNIVERSAL I (CUSTOM PROCEDURE TRAY) ×2 IMPLANT
PAD ARMBOARD 7.5X6 YLW CONV (MISCELLANEOUS) ×4 IMPLANT
PAD CAST 4YDX4 CTTN HI CHSV (CAST SUPPLIES) ×1 IMPLANT
PADDING CAST COTTON 4X4 STRL (CAST SUPPLIES) ×2
PADDING CAST COTTON 6X4 STRL (CAST SUPPLIES) ×2 IMPLANT
SET HNDPC FAN SPRY TIP SCT (DISPOSABLE) ×1 IMPLANT
STAPLER VISISTAT 35W (STAPLE) ×2 IMPLANT
SUCTION FRAZIER TIP 10 FR DISP (SUCTIONS) ×2 IMPLANT
SURGIFLO W/THROMBIN 8M KIT (HEMOSTASIS) IMPLANT
SUT BONE WAX W31G (SUTURE) ×2 IMPLANT
SUT ETHIBOND NAB CT1 #1 30IN (SUTURE) ×4 IMPLANT
SUT MNCRL AB 3-0 PS2 18 (SUTURE) ×2 IMPLANT
SUT VIC AB 0 CT1 27 (SUTURE) ×2
SUT VIC AB 0 CT1 27XBRD ANBCTR (SUTURE) ×1 IMPLANT
SYR CONTROL 10ML LL (SYRINGE) ×1 IMPLANT
TOWEL OR 17X24 6PK STRL BLUE (TOWEL DISPOSABLE) ×2 IMPLANT
TOWEL OR 17X26 10 PK STRL BLUE (TOWEL DISPOSABLE) ×2 IMPLANT
TRAY FOLEY CATH 16FRSI W/METER (SET/KITS/TRAYS/PACK) ×1 IMPLANT
WATER STERILE IRR 1000ML POUR (IV SOLUTION) ×2 IMPLANT
WRAP KNEE MAXI GEL POST OP (GAUZE/BANDAGES/DRESSINGS) ×2 IMPLANT

## 2015-05-08 NOTE — Transfer of Care (Signed)
Immediate Anesthesia Transfer of Care Note  Patient: [name]  Procedure(s) Performed: Procedure(s): TOTAL KNEE ARTHROPLASTY (Right)  Patient Location: PACU  Anesthesia Type:Spinal  Level of Consciousness: awake, alert , oriented and patient cooperative  Airway & Oxygen Therapy: Patient Spontanous Breathing and room air  Post-op Assessment: Report given to RN, Post -op Vital signs reviewed and stable and moving left leg  Post vital signs: Reviewed and stable  Last Vitals:  Filed Vitals:   05/08/15 0615  BP: 142/74  Pulse: 65  Temp: 36.5 C  Resp: 18    Complications: No apparent anesthesia complications

## 2015-05-08 NOTE — Addendum Note (Signed)
Addendum  created 05/08/15 1528 by Terrill Mohr, CRNA   Modules edited: Anesthesia Attestations, Anesthesia Medication Administration

## 2015-05-08 NOTE — H&P (Signed)
  The recent History & Physical has been reviewed. I have personally examined the patient today. There is no interval change to the documented History & Physical. The patient would like to proceed with the procedure.  [name] W 05/08/2015,  7:09 AM

## 2015-05-08 NOTE — Anesthesia Preprocedure Evaluation (Addendum)
Anesthesia Evaluation  Patient identified by MRN, date of birth, ID band Patient awake    Reviewed: Allergy & Precautions, NPO status , Patient's Chart, lab work & pertinent test results  Airway Mallampati: II  TM Distance: >3 FB Neck ROM: Full    Dental  (+) Teeth Intact, Dental Advisory Given   Pulmonary    breath sounds clear to auscultation       Cardiovascular  Rhythm:Regular     Neuro/Psych Anxiety Depression Fibromyalgia    GI/Hepatic GERD  ,  Endo/Other    Renal/GU      Musculoskeletal  (+) Arthritis , Fibromyalgia -  Abdominal   Peds  Hematology   Anesthesia Other Findings Very narrow palate  Reproductive/Obstetrics                           Anesthesia Physical Anesthesia Plan  ASA: III  Anesthesia Plan: MAC, Spinal and Regional   Post-op Pain Management:    Induction: Intravenous  Airway Management Planned: Simple Face Mask and Natural Airway  Additional Equipment:   Intra-op Plan:   Post-operative Plan:   Informed Consent: I have reviewed the patients History and Physical, chart, labs and discussed the procedure including the risks, benefits and alternatives for the proposed anesthesia with the patient or authorized representative who has indicated his/her understanding and acceptance.     Plan Discussed with: CRNA and Anesthesiologist  Anesthesia Plan Comments:         Anesthesia Quick Evaluation

## 2015-05-08 NOTE — Anesthesia Postprocedure Evaluation (Signed)
  Anesthesia Post-op Note  Patient: [name]  Procedure(s) Performed: Procedure(s): TOTAL KNEE ARTHROPLASTY (Right)  Patient Location: PACU  Anesthesia Type:MAC, Regional and Spinal  Level of Consciousness: awake, alert  and oriented  Airway and Oxygen Therapy: Patient Spontanous Breathing and Patient connected to nasal cannula oxygen  Post-op Pain: mild  Post-op Assessment: Post-op Vital signs reviewed, Patient's Cardiovascular Status Stable, Respiratory Function Stable, Patent Airway and Pain level controlled   LLE Sensation: Decreased RLE Motor Response: Purposeful movement, Responds to commands RLE Sensation: Decreased L Sensory Level: S1-Sole of foot, small toes R Sensory Level: S1-Sole of foot, small toes  Post-op Vital Signs: stable  Last Vitals:  Filed Vitals:   05/08/15 1302  BP:   Pulse: 71  Temp:   Resp: 35    Complications: No apparent anesthesia complications

## 2015-05-08 NOTE — Progress Notes (Signed)
Orthopedic Tech Progress Note Patient Details:  [name] [birth year] [phone] Put on cpm at 6:50 pm Patient ID: [name], female   DOB: 1950-10-07, 64 y.o.   MRN: [id number]   Braulio Bosch 05/08/2015, 6:54 PM

## 2015-05-08 NOTE — Op Note (Signed)
PATIENT ID:      [name]  MRN:     [id number] DOB/AGE:    [birth year] / 64 y.o.       OPERATIVE REPORT    DATE OF PROCEDURE:  05/08/2015       PREOPERATIVE DIAGNOSIS:   END STAGE, PRIMARY OSTEOARTHRITIS RIGHT KNEE                                                       Estimated body mass index is 37.85 kg/(m^2) as calculated from the following:   Height as of 04/30/15: 5\' 2"  (1.575 m).   Weight as of this encounter: 93.895 kg (207 lb).     POSTOPERATIVE DIAGNOSIS:   RIGHT KNEE OSTEOARTHRITIS-SAME                                                                   Estimated body mass index is 37.85 kg/(m^2) as calculated from the following:   Height as of 04/30/15: 5\' 2"  (1.575 m).   Weight as of this encounter: 93.895 kg (207 lb).     PROCEDURE:  Procedure(s):RIGHT TOTAL KNEE ARTHROPLASTY      SURGEON:  [name], MD    ASSISTANT:   Biagio Borg, PA-C   (Present and scrubbed throughout the case, critical for assistance with exposure, retraction, instrumentation, and closure.)          ANESTHESIA: regional and spinal     DRAINS: (RIGHT KNEE) Hemovact drain(s) in the CLAMPED with  Suction Clamped :      TOURNIQUET TIME:  Total Tourniquet Time Documented: Thigh (Right) - 78 minutes Total: Thigh (Right) - 78 minutes     COMPLICATIONS:  None   CONDITION:  stable  PROCEDURE IN DETAIL: [id number]   [name] W 05/08/2015, 9:31 AM

## 2015-05-08 NOTE — Evaluation (Signed)
Physical Therapy Evaluation Patient Details Name: [name] MRN: [id number] DOB: [birth year] Today's Date: 05/08/2015   History of Present Illness  64 y.o. female s/p Rt TKA. PMH: vertigo, chronic back pain, osteopenia, anxiety, panic attacks.   Clinical Impression  Pt is s/p TKA resulting in the deficits listed below (see PT Problem List).  Pt will benefit from skilled PT to increase their independence and safety with mobility to allow discharge to home with husband assistance. During initial session patient able to stand at edge of bed X2 but unable to tolerate ambulation or transfer to chair due to increasing complaints of dizziness. Will continue to progress as tolerated.       Follow Up Recommendations Home health PT;Supervision for mobility/OOB    Equipment Recommendations  None recommended by PT;Other (comment) (reports having rw at home)    Recommendations for Other Services       Precautions / Restrictions Precautions Precautions: Fall;Knee Precaution Comments: reviewed no pillows behind knee Restrictions Weight Bearing Restrictions: Yes RLE Weight Bearing: Partial weight bearing RLE Partial Weight Bearing Percentage or Pounds: 50%      Mobility  Bed Mobility Overal bed mobility: Needs Assistance Bed Mobility: Supine to Sit;Sit to Supine     Supine to sit: Mod assist;HOB elevated Sit to supine: Mod assist;HOB elevated   General bed mobility comments: assist with RLE and cues for sequence  Transfers Overall transfer level: Needs assistance Equipment used: Rolling walker (2 wheeled) Transfers: Sit to/from Stand Sit to Stand: Mod assist;From elevated surface         General transfer comment: repeated X2, patient with increasing dizziness with standing, requiring return to sitting and then back to supine.   Ambulation/Gait                Stairs            Wheelchair Mobility    Modified Rankin (Stroke Patients Only)       Balance  Overall balance assessment: Needs assistance Sitting-balance support: No upper extremity supported Sitting balance-Leahy Scale: Fair     Standing balance support: Bilateral upper extremity supported Standing balance-Leahy Scale: Poor Standing balance comment: using rw                             Pertinent Vitals/Pain Pain Assessment: 0-10 Pain Score: 5  Pain Location: Rt knee Pain Descriptors / Indicators: Aching Pain Intervention(s): Limited activity within patient's tolerance;Monitored during session    Home Living Family/patient expects to be discharged to:: Private residence Living Arrangements: Spouse/significant other Available Help at Discharge: Family;Available 24 hours/day Type of Home: House Home Access: Ramped entrance     Home Layout: One level Home Equipment: Walker - 2 wheels;Cane - single point      Prior Function Level of Independence: Independent with assistive device(s) (using cane at times)               Hand Dominance        Extremity/Trunk Assessment               Lower Extremity Assessment: RLE deficits/detail RLE Deficits / Details: mod assist with SLR       Communication   Communication: No difficulties  Cognition Arousal/Alertness: Awake/alert Behavior During Therapy: WFL for tasks assessed/performed Overall Cognitive Status: Within Functional Limits for tasks assessed  General Comments      Exercises Total Joint Exercises Ankle Circles/Pumps: AROM;10 reps;Both Quad Sets: Strengthening;Right;5 reps Gluteal Sets: Strengthening;Both;5 reps      Assessment/Plan    PT Assessment Patient needs continued PT services  PT Diagnosis Difficulty walking;Acute pain   PT Problem List Decreased strength;Decreased range of motion;Decreased activity tolerance;Decreased balance;Decreased mobility;Decreased knowledge of use of DME;Pain  PT Treatment Interventions DME instruction;Gait  training;Stair training;Functional mobility training;Therapeutic activities;Therapeutic exercise;Patient/family education   PT Goals (Current goals can be found in the Care Plan section) Acute Rehab PT Goals Patient Stated Goal: be able to get up moving again PT Goal Formulation: With patient Time For Goal Achievement: 05/22/15 Potential to Achieve Goals: Good    Frequency 7X/week   Barriers to discharge        Co-evaluation               End of Session Equipment Utilized During Treatment: Gait belt Activity Tolerance: Other (comment) (limited by increasing dizziness with standing) Patient left: in bed;with call bell/phone within reach;Other (comment) (in extension stretch) Nurse Communication: Mobility status;Other (comment);Weight bearing status (dizziness with standing)         Time: 7681-1572 PT Time Calculation (min) (ACUTE ONLY): 33 min   Charges:   PT Evaluation $Initial PT Evaluation Tier I: 1 Procedure PT Treatments $Therapeutic Activity: 8-22 mins   PT G Codes:        Cassell Clement, PT, CSCS Pager [phone] Office [phone]  05/08/2015, 4:28 PM

## 2015-05-08 NOTE — Progress Notes (Signed)
Orthopedic Tech Progress Note Patient Details:  [name] [birth year] [id number]  CPM Right Knee CPM Right Knee: On Right Knee Flexion (Degrees): 90 Right Knee Extension (Degrees): 0 Additional Comments: trapeze bar patient helper Viewed order from doctor's order list  Hildred Priest 05/08/2015, 10:40 AM

## 2015-05-08 NOTE — Anesthesia Procedure Notes (Addendum)
Anesthesia Regional Block:  Adductor canal block  Pre-Anesthetic Checklist: ,, timeout performed, Correct Patient, Correct Site, Correct Laterality, Correct Procedure, Correct Position, site marked, Risks and benefits discussed,  Surgical consent,  Pre-op evaluation,  At surgeon's request and post-op pain management  Laterality: Right  Prep: chloraprep       Needles:  Injection technique: Single-shot     Needle Length: 9cm 9 cm Needle Gauge: 21 and 21 G    Additional Needles:  Procedures: ultrasound guided (picture in chart) Adductor canal block Narrative:  Start time: 05/08/2015 7:15 AM End time: 05/08/2015 7:20 AM Injection made incrementally with aspirations every 5 mL.  Performed by: Personally   Additional Notes: 30 cc 0.5% Marcaine 1:200 epi injected easily   Spinal Patient location during procedure: OR Start time: 05/08/2015 7:30 AM End time: 05/08/2015 7:12 AM Staffing Performed by: anesthesiologist  Preanesthetic Checklist Completed: patient identified, site marked, surgical consent, pre-op evaluation, timeout performed, IV checked, risks and benefits discussed and monitors and equipment checked Spinal Block Patient position: sitting Patient monitoring: heart rate, cardiac monitor, continuous pulse ox and blood pressure Approach: midline Location: L3-4 Injection technique: single-shot Needle Needle type: Tuohy  Needle gauge: 22 G Needle length: 9 cm Assessment Sensory level: T6 Additional Notes 10 mg 0.75% bupivacaine injected easily

## 2015-05-08 NOTE — Progress Notes (Signed)
Utilization review completed.  

## 2015-05-09 ENCOUNTER — Encounter (HOSPITAL_COMMUNITY): Payer: Self-pay | Admitting: Orthopaedic Surgery

## 2015-05-09 LAB — CBC
HEMATOCRIT: 33.2 % — AB (ref 36.0–46.0)
HEMOGLOBIN: 10.7 g/dL — AB (ref 12.0–15.0)
MCH: 30.9 pg (ref 26.0–34.0)
MCHC: 32.2 g/dL (ref 30.0–36.0)
MCV: 96 fL (ref 78.0–100.0)
Platelets: 241 10*3/uL (ref 150–400)
RBC: 3.46 MIL/uL — AB (ref 3.87–5.11)
RDW: 14.5 % (ref 11.5–15.5)
WBC: 7.7 10*3/uL (ref 4.0–10.5)

## 2015-05-09 LAB — BASIC METABOLIC PANEL
ANION GAP: 9 (ref 5–15)
BUN: 11 mg/dL (ref 6–20)
CHLORIDE: 102 mmol/L (ref 101–111)
CO2: 28 mmol/L (ref 22–32)
Calcium: 8.9 mg/dL (ref 8.9–10.3)
Creatinine, Ser: 0.65 mg/dL (ref 0.44–1.00)
GFR calc Af Amer: >60 mL/min (ref >60)
GFR calc non Af Amer: >60 mL/min (ref >60)
GLUCOSE: 113 mg/dL — AB (ref 65–99)
Potassium: 4.7 mmol/L (ref 3.5–5.1)
Sodium: 139 mmol/L (ref 135–145)

## 2015-05-09 MED ORDER — HYDROMORPHONE HCL 2 MG PO TABS
2.0000 mg | ORAL_TABLET | ORAL | Status: DC | PRN
  Administered 2015-05-09 (×4): 2 mg via ORAL
  Administered 2015-05-10: 4 mg via ORAL
  Administered 2015-05-10: 2 mg via ORAL
  Filled 2015-05-09 (×7): qty 1

## 2015-05-09 NOTE — Progress Notes (Signed)
Patient ID: [name], female   DOB: 1951/01/09, 64 y.o.   MRN: [id number] PATIENT ID: [name]        MRN:  [id number]          DOB/AGE: Mar 03, 1951 / 64 y.o.    Emma Fears, MD   Emma Borg, PA-C [address] [place],   [address]                             [phone]   PROGRESS NOTE  Subjective:  negative for Chest Pain  negative for Shortness of Breath  negative for Nausea/Vomiting   negative for Calf Pain    Tolerating Diet: yes         Patient reports pain as mild and moderate.     Had problem with pain yesterday but tolerated IV Dilaudid  Objective: Vital signs in last 24 hours:   Patient Vitals for the past 24 hrs:  BP Temp Temp src Pulse Resp SpO2  05/09/15 0354 (!) 131/57 mmHg 97.8 F (36.6 C) Oral 74 18 98 %  05/08/15 1945 128/60 mmHg 97.8 F (36.6 C) Oral 74 18 92 %  05/08/15 1436 130/67 mmHg (!) 96.8 F (36 C) - 73 18 96 %  05/08/15 1400 - - - 70 20 99 %  05/08/15 1345 - - - 72 16 97 %  05/08/15 1339 - - - 69 14 98 %  05/08/15 1330 - - - 71 (!) 9 96 %  05/08/15 1315 - - - 73 14 96 %  05/08/15 1302 - - - 71 (!) 35 100 %  05/08/15 1300 - - - 69 (!) 22 98 %  05/08/15 1250 (!) 133/53 mmHg - - 75 10 97 %  05/08/15 1245 - - - 71 18 97 %  05/08/15 1237 (!) 128/56 mmHg - - 69 17 98 %  05/08/15 1230 - - - 69 14 95 %  05/08/15 1215 - - - 78 (!) 23 97 %  05/08/15 1200 - - - 73 17 97 %  05/08/15 1151 138/60 mmHg - - 71 18 99 %  05/08/15 1145 - - - 65 (!) 9 98 %  05/08/15 1130 - - - 66 12 94 %  05/08/15 1115 - - - 66 11 96 %  05/08/15 1100 - - - 65 11 97 %  05/08/15 1058 - 97.7 F (36.5 C) - 66 12 96 %  05/08/15 1045 - - - 68 12 96 %  05/08/15 1043 - 97.7 F (36.5 C) - 64 11 99 %  05/08/15 1030 - - - 67 11 94 %  05/08/15 1028 - - - 70 14 97 %  05/08/15 1020 - (!) 95.2 F (35.1 C) - - - -  05/08/15 1015 - - - 67 15 100 %  05/08/15 1013 - - - 66 19 99 %  05/08/15 1000 - - - 64 15 100 %  05/08/15 0958 130/62 mmHg - - 71 20 96 %        Intake/Output from previous day:   10/25 0701 - 10/26 0700 In: 2140 [P.O.:300; I.V.:1785] Out: 3180 [Urine:2500; Drains:580]   Intake/Output this shift:       Intake/Output      10/25 0701 - 10/26 0700 10/26 0701 - 10/27 0700   P.O. 300    I.V. (mL/kg) 1785 (19)    IV Piggyback 55    Total Intake(mL/kg) 2140 (22.8)  Urine (mL/kg/hr) 2500 (1.1)    Drains 580 (0.3)    Blood 100 (0)    Total Output 3180     Net -1040             LABORATORY DATA:  Recent Labs  05/09/15 0308  WBC 7.7  HGB 10.7*  HCT 33.2*  PLT 241    Recent Labs  05/09/15 0308  NA 139  K 4.7  CL 102  CO2 28  BUN 11  CREATININE 0.65  GLUCOSE 113*  CALCIUM 8.9   Lab Results  Component Value Date   INR 1.01 04/30/2015    Recent Radiographic Studies :  No results found.   Examination:  General appearance: alert, cooperative, mild distress and moderate distress Resp: clear to auscultation bilaterally Cardio: regular rate and rhythm GI: normal findings: bowel sounds normal  Wound Exam: clean, dry, intact dressing  Drainage:  None: wound tissue dry  Motor Exam: EHL, FHL, Anterior Tibial and Posterior Tibial Intact  Sensory Exam: Superficial Peroneal, Deep Peroneal and Tibial normal  Vascular Exam: Right posterior tibial artery has 1+ (weak) pulse  Assessment:    1 Day Post-Op  Procedure(s) (LRB): TOTAL KNEE ARTHROPLASTY (Right)  ADDITIONAL DIAGNOSIS:  Principal Problem:   Primary osteoarthritis of right knee Active Problems:   Primary osteoarthritis of knee     Plan: Physical Therapy as ordered Partial Weight Bearing @ 50% (PWB)  DVT Prophylaxis:  Xarelto, Foot Pumps and TED hose  DISCHARGE PLAN: Home  DISCHARGE NEEDS: HHPT, CPM, Walker and 3-in-1 comode seat   Will try PO Dilaudid since she tolerated IV Dilaudid.  Will hold Tramadol        [name]  05/09/2015 8:14 AM

## 2015-05-09 NOTE — Op Note (Signed)
NAMEMARSHELL, RIEGER NO.:  [id number]  MEDICAL RECORD NO.:  [id number]  LOCATION:  5N20C                        FACILITY:  [facility]  PHYSICIAN:  Vonna Kotyk. Feleshia Zundel, M.D.DATE OF BIRTH:  1950-08-14  DATE OF PROCEDURE:  05/08/2015 DATE OF DISCHARGE:                              OPERATIVE REPORT   PREOPERATIVE DIAGNOSIS:  End-stage primary osteoarthritis, right knee.  POSTOPERATIVE DIAGNOSIS:  End-stage primary osteoarthritis, right knee.  PROCEDURE:  Right total knee arthroplasty.  SURGEON:  Vonna Kotyk. Durward Fortes, M.D.  ASSISTANT:  Aaron Edelman D. Petrarca, PA-C  ANESTHESIA:  Spinal with femoral nerve block.  COMPLICATIONS:  None.  COMPONENTS:  DePuy LCS standard femoral component.  A #3 rotating keeled tibial tray with a 15 mm polyethylene bridging bearing and metal-backed 3-peg rotating patella.  Components were secured with polymethyl methacrylate.  DESCRIPTION OF PROCEDURE:  Ms. Ganesh was met in the holding area, identified the right knee as appropriate operative site, and marked it accordingly.  She received preoperative femoral nerve block per Anesthesia.  She was then transported to room #7.  A spinal was performed by Anesthesia.  The right lower extremity was then placed in a thigh tourniquet.  The leg was prepped with chlorhexidine scrub and DuraPrep x2.  Sterile draping was performed.  Time-out was called.  The extremity was then elevated and Esmarch exsanguinated with a proximal tourniquet at 350 mmHg.  An examination revealed hyperextension of her knee.  There was no particular opening with varus or valgus stress, but she did have loose joints.  X-rays were consistent with end-stage osteoarthritis with large osteophytes along the medial and lateral femoral condyle and decreased joint spaces, particularly medially.  The patient did not have a fixed varus or flexion contracture.  A midline longitudinal incision was made centered about the  patella, extending from the superior pouch to tibial tubercle.  Via sharp dissection, incision was carried down to the subcutaneous tissue.  There was abundant adipose tissue.  The patient did have large knees.  First layer of capsule was incised in the midline.  A medial parapatellar incision was then made with the Bovie to the deep capsule.  The joint was entered.  There was a small clear yellow joint effusion.  Patella was everted to 180 degrees laterally, knee flexed to 90 degrees.  There was mild-to-moderate amount of synovitis.  It was not beefy red in appearance, but synovectomy was performed.  There were large osteophytes along the medial and to a lesser extent the lateral femoral condyle. These were removed with sizing purposes.  I sized a standard femoral component.  First, bony cut was then made transversely in the proximal tibia with a 7-degree angle of declination with each bony cut on the tibia and subsequently on the femur.  We used the external tibial guide to be sure we had appropriate alignment.  As the patient did have very loose ligaments, I removed a minimal amount of bone.  Subsequent cuts were then made on the femur using the standard femoral jig.  I used a 4-degree distal femoral valgus cut.  Flexion and extension gaps were perfectly symmetrical at 15 mm despite removing minimal bone.  MCL and LCL  remained intact throughout the procedure. Lamina spreaders were placed along the medial lateral compartment to remove medial lateral menisci as well as ACL and PCL.  Osteophytes were removed from the posterior femoral condyle using a 3/4-inch curved osteotome.  Again, we checked to be sure of flexion and extension gaps were symmetrical of 15 mm.  As mentioned, distal femur was cut with a 4-degree distal femoral valgus cut.  The finishing guide was applied to obtain the tapering cuts in the center hole.  Retractors in place around the tibia, was advanced anteriorly.   We trialed #3 tibial tray with excellent fit.  This was impacted in place. After making the center hole, the keel cut was made.  With the tibial jig in place, we applied the 15 mm polyethylene bridging bearing followed by the trial standard femoral component.  The entire construct was reduced through full range of motion.  We had full extension and flexion without malrotation of the polyethylene bearing.  We had full extension, but not hyperextension.  No opening with varus or valgus stress.  The patella was then prepared by removing approximately 8 mm of bone, leaving 14 mm of patella thickness.  Three holes were then made.  A trial patella was inserted through full range of motion and perfectly stable.  I was impressed that the patient's bone was soft.  The trial components were removed.  The joint was copiously irrigated with saline solution.  The final components were then impacted with polymethyl methacrylate.  We initially applied the #3 tibial tray followed by the 15 mm polyethylene bridging bearing and the standard femoral component.  These were impacted in place.  Extraneous methacrylate was removed from the periphery of the components.  The patella was applied with methacrylate and a patellar clamp.  At approximately 17 minutes, the methacrylate had matured.  We did inject the joint with 0.25% Marcaine with epinephrine.  The joint was copiously irrigated with saline solution.  Tourniquet was deflated.  We used tranexamic acid in place for approximately 5 minutes.  We had excellent capillary refill to the joint surface.  Any gross bleeders were Bovie coagulated.  At that point, we inserted a Hemovac in the lateral compartment.  The deep capsule was then closed with #1 Ethibond, superficial capsule with running 0 Vicryl, subcu with 3-0 Monocryl, skin closed with skin clips.  Sterile bulky dressing was applied followed by an Ace bandage followed by M Mepilex dressing in the  patient's support stocking.  The patient tolerated the procedure well without complications.    Vonna Kotyk. Durward Fortes, M.D.    PWW/MEDQ  D:  05/08/2015  T:  05/09/2015  Job:  [id number]

## 2015-05-09 NOTE — Evaluation (Signed)
Occupational Therapy Evaluation Patient Details Name: [name] MRN: [id number] DOB: 02/01/1951 Today's Date: 05/09/2015    History of Present Illness 64 y.o. female s/p Rt TKA. PMH: vertigo, chronic back pain, osteopenia, anxiety, panic attacks.    Clinical Impression   Pt reports she was independent with ADLs PTA. Currently pt is overall min guard for ADLs and functional mobility. Pt with increased dizziness upon standing and nausea at end of session; RN notified. Educated pt on compensatory strategies for LB ADLs, edema management techniques, use of 3 in 1 in tub, use of sheet to assist with RLE during bed mobility; pt verbalized understanding. Pt plan to d/c home with 24/7 supervision from her husband. Pt would benefit from continued skilled OT in order to increase independence and safety with toilet and tub transfers using a 3 in 1.     Follow Up Recommendations  No OT follow up;Supervision - Intermittent    Equipment Recommendations  None recommended by OT (pt reports that she already has 3 in 1 )    Recommendations for Other Services       Precautions / Restrictions Precautions Precautions: Fall;Knee Restrictions Weight Bearing Restrictions: Yes RLE Weight Bearing: Partial weight bearing RLE Partial Weight Bearing Percentage or Pounds: 50      Mobility Bed Mobility               General bed mobility comments: Pt in recliner returned to recliner at end of session  Transfers Overall transfer level: Needs assistance Equipment used: Rolling walker (2 wheeled) Transfers: Sit to/from Stand Sit to Stand: Min guard         General transfer comment: Min guard for safety. Good hand plaecment and technique. Sit <> stand from chair x 1    Balance Overall balance assessment: Needs assistance         Standing balance support: Bilateral upper extremity supported Standing balance-Leahy Scale: Poor Standing balance comment: RW for support                            ADL Overall ADL's : Needs assistance/impaired Eating/Feeding: Set up;Sitting                   Lower Body Dressing: Min guard;Sit to/from stand Lower Body Dressing Details (indicate cue type and reason): Min guard for safety during sit <> stand. Pt able to don/doff B socks Toilet Transfer: Min guard;Ambulation;BSC;RW (BSC over toilet) Toilet Transfer Details (indicate cue type and reason): Pt reports she was able to walk to the bathroom with NT earlier this morning          Functional mobility during ADLs: Min guard;Rolling walker General ADL Comments: No family present during OT eval. Educated pt on compensatory strategies for LB ADLs, edema management techniques, use of 3 in 1 in tub, use of sheet to assist with managing RLE for bed mobility; pt verbalized understanding.       Vision     Perception     Praxis      Pertinent Vitals/Pain Pain Assessment: Faces Faces Pain Scale: Hurts even more Pain Location: R knee Pain Descriptors / Indicators: Aching;Grimacing Pain Intervention(s): Limited activity within patient's tolerance;Monitored during session;Repositioned;Ice applied     Hand Dominance     Extremity/Trunk Assessment Upper Extremity Assessment Upper Extremity Assessment: Overall WFL for tasks assessed   Lower Extremity Assessment Lower Extremity Assessment: Defer to PT evaluation       Communication  Communication Communication: No difficulties   Cognition Arousal/Alertness: Awake/alert Behavior During Therapy: WFL for tasks assessed/performed Overall Cognitive Status: Within Functional Limits for tasks assessed                     General Comments       Exercises       Shoulder Instructions      Home Living Family/patient expects to be discharged to:: Private residence Living Arrangements: Spouse/significant other Available Help at Discharge: Family;Available 24 hours/day Type of Home: House Home Access:  Ramped entrance     Home Layout: One level     Bathroom Shower/Tub: Tub/shower unit Shower/tub characteristics: Curtain Biochemist, clinical: Handicapped height Bathroom Accessibility: Yes How Accessible: Accessible via walker Home Equipment: [place] - 2 wheels;Cane - single point;Bedside commode          Prior Functioning/Environment Level of Independence: Independent with assistive device(s) (using cane at times)             OT Diagnosis: Generalized weakness;Acute pain   OT Problem List: Decreased activity tolerance;Impaired balance (sitting and/or standing);Decreased safety awareness;Decreased knowledge of use of DME or AE;Decreased knowledge of precautions;Pain   OT Treatment/Interventions: Self-care/ADL training;DME and/or AE instruction;Patient/family education    OT Goals(Current goals can be found in the care plan section) Acute Rehab OT Goals Patient Stated Goal: return to PLOF OT Goal Formulation: With patient Time For Goal Achievement: 05/23/15 Potential to Achieve Goals: Good ADL Goals Pt Will Perform Grooming: with supervision;standing Pt Will Transfer to Toilet: with supervision;ambulating;bedside commode (over toilet) Pt Will Perform Tub/Shower Transfer: Tub transfer;with supervision;ambulating;3 in 1;rolling walker  OT Frequency: Min 2X/week   Barriers to D/C:            Co-evaluation              End of Session Equipment Utilized During Treatment: Gait belt;Rolling walker CPM Right Knee CPM Right Knee: Off Nurse Communication: Other (comment) (Pt with dizziness upon standing and nausea )  Activity Tolerance: Other (comment) (Limited by dizziness and nausesa ) Patient left: in chair;with call bell/phone within reach   Time: 2500-3704 OT Time Calculation (min): 25 min Charges:  OT General Charges $OT Visit: 1 Procedure OT Evaluation $Initial OT Evaluation Tier I: 1 Procedure OT Treatments $Self Care/Home Management : 8-22 mins G-Codes:      Binnie Kand M.S., OTR/L Pager: [phone]  05/09/2015, 10:16 AM

## 2015-05-09 NOTE — Progress Notes (Signed)
Physical Therapy Treatment Patient Details Name: [name] MRN: [id number] DOB: 31-Jul-1950 Today's Date: 05/09/2015    History of Present Illness 64 y.o. female s/p Rt TKA. PMH: vertigo, chronic back pain, osteopenia, anxiety, panic attacks.     PT Comments    Patient is making gradual progress with PT.  Patient able to ambulate 20 feet with min guard and rw, limited by fatigue and reports of mild dizziness. From a mobility standpoint anticipate patient will ultimately DC home with family support.      Follow Up Recommendations  Home health PT;Supervision for mobility/OOB     Equipment Recommendations  None recommended by PT    Recommendations for Other Services       Precautions / Restrictions Precautions Precautions: Fall;Knee Precaution Booklet Issued: Yes (comment) Precaution Comments: HEP provided Restrictions Weight Bearing Restrictions: Yes RLE Weight Bearing: Partial weight bearing RLE Partial Weight Bearing Percentage or Pounds: 50    Mobility  Bed Mobility               General bed mobility comments: in chair upon arrival  Transfers Overall transfer level: Needs assistance Equipment used: Rolling walker (2 wheeled) Transfers: Sit to/from Stand Sit to Stand: Min guard         General transfer comment: no cues needed for hand placement. Mild dizziness with initial standing.   Ambulation/Gait Ambulation/Gait assistance: Min guard Ambulation Distance (Feet): 20 Feet Assistive device: Rolling walker (2 wheeled) Gait Pattern/deviations: Step-to pattern Gait velocity: decreased   General Gait Details: PWB during ambulation, reminders during session to maintain 50%.    Stairs            Wheelchair Mobility    Modified Rankin (Stroke Patients Only)       Balance Overall balance assessment: Needs assistance Sitting-balance support: No upper extremity supported Sitting balance-Leahy Scale: Good     Standing balance support:  Bilateral upper extremity supported Standing balance-Leahy Scale: Poor Standing balance comment: using rw                    Cognition Arousal/Alertness: Awake/alert Behavior During Therapy: WFL for tasks assessed/performed Overall Cognitive Status: Within Functional Limits for tasks assessed                      Exercises Total Joint Exercises Ankle Circles/Pumps: AROM;10 reps;Both Quad Sets: Strengthening;Right;10 reps Heel Slides: AAROM;Right;10 reps Straight Leg Raises: Strengthening;Right;10 reps;Other (comment) (mod assist)    General Comments       Pertinent Vitals/Pain Pain Assessment: 0-10 Pain Score: 4  Faces Pain Scale: Hurts even more Pain Location: Rt knee Pain Descriptors / Indicators: Aching Pain Intervention(s): Limited activity within patient's tolerance;Monitored during session;Ice applied    Home Living       Prior Function           PT Goals (current goals can now be found in the care plan section) Acute Rehab PT Goals Patient Stated Goal: be able to get back home PT Goal Formulation: With patient Time For Goal Achievement: 05/22/15 Potential to Achieve Goals: Good Progress towards PT goals: Progressing toward goals    Frequency  7X/week    PT Plan Current plan remains appropriate    Co-evaluation             End of Session Equipment Utilized During Treatment: Gait belt Activity Tolerance: Patient limited by fatigue;Other (comment) (mild dizziness reported during ambulation) Patient left: in chair;with call bell/phone within reach;Other (comment) (in  extension stretch)     Time: 5188-4166 PT Time Calculation (min) (ACUTE ONLY): 25 min  Charges:  $Gait Training: 8-22 mins $Therapeutic Exercise: 8-22 mins                    G Codes:      Cassell Clement, PT, CSCS Pager [phone] Office [phone]  05/09/2015, 10:41 AM

## 2015-05-09 NOTE — Progress Notes (Signed)
Physical Therapy Treatment Patient Details Name: [name] MRN: [id number] DOB: 1951-04-04 Today's Date: 05/09/2015    History of Present Illness 64 y.o. female s/p Rt TKA. PMH: vertigo, chronic back pain, osteopenia, anxiety, panic attacks.     PT Comments    Patient is making gradual progress with PT.  From a mobility standpoint anticipate patient will ultimately DC home with family support. Able to ambulate 50 feet with rw, appears to be consistent with 50% WB but reminder cues used as needed. Will continue to progress mobility as tolerated.       Follow Up Recommendations  Home health PT;Supervision for mobility/OOB     Equipment Recommendations  None recommended by PT    Recommendations for Other Services       Precautions / Restrictions Precautions Precautions: Fall;Knee Restrictions Weight Bearing Restrictions: Yes RLE Weight Bearing: Partial weight bearing RLE Partial Weight Bearing Percentage or Pounds: 50    Mobility  Bed Mobility Overal bed mobility: Needs Assistance Bed Mobility: Sit to Supine       Sit to supine: Mod assist (Rt LE)      Transfers Overall transfer level: Needs assistance Equipment used: Rolling walker (2 wheeled) Transfers: Sit to/from Stand Sit to Stand: Min guard         General transfer comment: denies dizziness with standing  Ambulation/Gait Ambulation/Gait assistance: Min guard Ambulation Distance (Feet): 50 Feet Assistive device: Rolling walker (2 wheeled) Gait Pattern/deviations: Step-to pattern;Decreased weight shift to right Gait velocity: decreased   General Gait Details: PWB during ambulation, reminders during session to maintain 50%.    Stairs            Wheelchair Mobility    Modified Rankin (Stroke Patients Only)       Balance Overall balance assessment: Needs assistance Sitting-balance support: No upper extremity supported Sitting balance-Leahy Scale: Good     Standing balance  support: Bilateral upper extremity supported Standing balance-Leahy Scale: Poor Standing balance comment: using rw                    Cognition Arousal/Alertness: Awake/alert Behavior During Therapy: WFL for tasks assessed/performed Overall Cognitive Status: Within Functional Limits for tasks assessed                      Exercises Total Joint Exercises Ankle Circles/Pumps: AROM;10 reps;Both Quad Sets: Strengthening;Right;10 reps Short Arc Quad: Strengthening;Right;10 reps Heel Slides: AAROM;Right;10 reps Straight Leg Raises: Strengthening;Right;10 reps;Other (comment) (min/mod assist) Goniometric ROM: 74 degrees flexion    General Comments        Pertinent Vitals/Pain Pain Assessment: 0-10 Pain Score: 5  Pain Location: Rt knee Pain Descriptors / Indicators: Aching Pain Intervention(s): Limited activity within patient's tolerance;Monitored during session;Ice applied    Home Living                      Prior Function            PT Goals (current goals can now be found in the care plan section) Acute Rehab PT Goals Patient Stated Goal: be able to get back home PT Goal Formulation: With patient Time For Goal Achievement: 05/22/15 Potential to Achieve Goals: Good Progress towards PT goals: Progressing toward goals    Frequency  7X/week    PT Plan Current plan remains appropriate    Co-evaluation             End of Session Equipment Utilized During Treatment: Gait belt  Activity Tolerance: Patient limited by fatigue Patient left: in bed;in CPM;with call bell/phone within reach;with SCD's reapplied     Time: 5872-7618 PT Time Calculation (min) (ACUTE ONLY): 30 min  Charges:  $Gait Training: 8-22 mins $Therapeutic Exercise: 8-22 mins                    G Codes:      Cassell Clement, PT, CSCS Pager [phone] Office [phone]  05/09/2015, 3:15 PM

## 2015-05-09 NOTE — Care Management Note (Signed)
Case Management Note  Patient Details  Name: [name] MRN: [id number] Date of Birth: 04-08-1951  Subjective/Objective:               S/p right total knee arthroplasty     Action/Plan: Set up with [facility] for HHPT by MD office. Spoke with patient, no change in discharge plan. Patient stated that [employer] delivered CPM and rolling walker to home, already had 3N1. Patient stated that her husband will be able to assist her after discharge.   Expected Discharge Date:                  Expected Discharge Plan:  [place]  In-House Referral:  NA  Discharge planning Services  CM Consult  Post Acute Care Choice:  Durable Medical Equipment, Home Health Choice offered to:  Patient  DME Arranged:  CPM, Walker rolling DME Agency:  [employer]  HH Arranged:  PT HH Agency:  [facility]  Status of Service:  Completed, signed off  Medicare Important Message Given:    Date Medicare IM Given:    Medicare IM give by:    Date Additional Medicare IM Given:    Additional Medicare Important Message give by:     If discussed at [place] of Stay Meetings, dates discussed:    Additional Comments:  Nila Nephew, RN 05/09/2015, 2:22 PM

## 2015-05-09 NOTE — Discharge Instructions (Signed)
Information on my medicine - XARELTO® (Rivaroxaban) ° °This medication education was reviewed with me or my healthcare representative as part of my discharge preparation.  The pharmacist that spoke with me during my hospital stay was:  Shandiin Eisenbeis Kay, RPH ° °Why was Xarelto® prescribed for you? °Xarelto® was prescribed for you to reduce the risk of blood clots forming after orthopedic surgery. The medical term for these abnormal blood clots is venous thromboembolism (VTE). ° °What do you need to know about xarelto® ? °Take your Xarelto® ONCE DAILY at the same time every day. °You may take it either with or without food. ° °If you have difficulty swallowing the tablet whole, you may crush it and mix in applesauce just prior to taking your dose. ° °Take Xarelto® exactly as prescribed by your doctor and DO NOT stop taking Xarelto® without talking to the doctor who prescribed the medication.  Stopping without other VTE prevention medication to take the place of Xarelto® may increase your risk of developing a clot. ° °After discharge, you should have regular check-up appointments with your healthcare provider that is prescribing your Xarelto®.   ° °What do you do if you miss a dose? °If you miss a dose, take it as soon as you remember on the same day then continue your regularly scheduled once daily regimen the next day. Do not take two doses of Xarelto® on the same day.  ° °Important Safety Information °A possible side effect of Xarelto® is bleeding. You should call your healthcare provider right away if you experience any of the following: °? Bleeding from an injury or your nose that does not stop. °? Unusual colored urine (red or dark brown) or unusual colored stools (red or black). °? Unusual bruising for unknown reasons. °? A serious fall or if you hit your head (even if there is no bleeding). ° °Some medicines may interact with Xarelto® and might increase your risk of bleeding while on Xarelto®. To help avoid  this, consult your healthcare provider or pharmacist prior to using any new prescription or non-prescription medications, including herbals, vitamins, non-steroidal anti-inflammatory drugs (NSAIDs) and supplements. ° °This website has more information on Xarelto®: www.xarelto.com. ° ° °

## 2015-05-10 DIAGNOSIS — M8580 Other specified disorders of bone density and structure, unspecified site: Secondary | ICD-10-CM | POA: Diagnosis present

## 2015-05-10 DIAGNOSIS — F32A Depression, unspecified: Secondary | ICD-10-CM | POA: Diagnosis present

## 2015-05-10 DIAGNOSIS — F329 Major depressive disorder, single episode, unspecified: Secondary | ICD-10-CM | POA: Diagnosis present

## 2015-05-10 LAB — BASIC METABOLIC PANEL
ANION GAP: 4 — AB (ref 5–15)
BUN: 12 mg/dL (ref 6–20)
CO2: 28 mmol/L (ref 22–32)
Calcium: 8.3 mg/dL — ABNORMAL LOW (ref 8.9–10.3)
Chloride: 102 mmol/L (ref 101–111)
Creatinine, Ser: 0.83 mg/dL (ref 0.44–1.00)
GLUCOSE: 118 mg/dL — AB (ref 65–99)
POTASSIUM: 4.7 mmol/L (ref 3.5–5.1)
Sodium: 134 mmol/L — ABNORMAL LOW (ref 135–145)

## 2015-05-10 LAB — CBC
HEMATOCRIT: 33.1 % — AB (ref 36.0–46.0)
HEMOGLOBIN: 10.4 g/dL — AB (ref 12.0–15.0)
MCH: 30.5 pg (ref 26.0–34.0)
MCHC: 31.4 g/dL (ref 30.0–36.0)
MCV: 97.1 fL (ref 78.0–100.0)
Platelets: 250 10*3/uL (ref 150–400)
RBC: 3.41 MIL/uL — AB (ref 3.87–5.11)
RDW: 14.5 % (ref 11.5–15.5)
WBC: 8.3 10*3/uL (ref 4.0–10.5)

## 2015-05-10 MED ORDER — HYDROMORPHONE HCL 2 MG PO TABS
2.0000 mg | ORAL_TABLET | ORAL | Status: DC | PRN
  Administered 2015-05-10: 4 mg via ORAL
  Filled 2015-05-10: qty 2

## 2015-05-10 MED ORDER — RIVAROXABAN 10 MG PO TABS: 10.0000 mg | ORAL_TABLET | Freq: Every day | ORAL | Status: DC

## 2015-05-10 MED ORDER — METHOCARBAMOL 500 MG PO TABS: 500.0000 mg | ORAL_TABLET | Freq: Three times a day (TID) | ORAL | Status: DC | PRN

## 2015-05-10 MED ORDER — HYDROMORPHONE HCL 2 MG PO TABS: 2.0000 mg | ORAL_TABLET | ORAL | Status: DC | PRN

## 2015-05-10 NOTE — Discharge Summary (Signed)
Emma Fears, MD   Emma Borg, PA-C [address], [place], [place]  [address]                             [phone]  PATIENT ID: [name]        MRN:  [id number]          DOB/AGE: [birth year] / 64 y.o.    DISCHARGE SUMMARY  ADMISSION DATE:    05/08/2015 DISCHARGE DATE:   05/10/2015   ADMISSION DIAGNOSIS: RIGHT KNEE OSTEOARTHRITIS, MEDIAL MENISCUS TEAR    DISCHARGE DIAGNOSIS:  RIGHT KNEE OSTEOARTHRITIS, MEDIAL MENISCUS TEAR    ADDITIONAL DIAGNOSIS: Principal Problem:   Primary osteoarthritis of right knee Active Problems:   Primary osteoarthritis of knee   Osteopenia   Depression  Past Medical History  Diagnosis Date  . Arthritis   . Fibromyalgia   . Depression     takes Cymbalta daily  . PONV (postoperative nausea and vomiting)   . Family history of adverse reaction to anesthesia     mom wakes up fighting and sick  . Bruises easily   . History of bronchitis early Aug 2016  . Vertigo   . Weakness     in hands  . Joint pain   . Joint swelling   . Chronic back pain     DDD  . Osteoarthritis   . Osteopenia   . Dry skin   . GERD (gastroesophageal reflux disease)     occasionally but will take Tums if needed  . IBS (irritable bowel syndrome)   . Urinary frequency   . Urinary urgency   . Cataracts, bilateral     immature  . Glaucoma   . Anxiety   . Panic attacks   . Insomnia   . History of shingles     PROCEDURE: Procedure(s): TOTAL KNEE ARTHROPLASTY Right on 05/08/2015  CONSULTS: none     HISTORY: Emma a very pleasant 64 year old white female who is seen today for evaluation of her right knee. She is a patient of Dr. Estanislado Pandy, rheumatologist, with a history of fibromyalgia, as well as osteoarthritis of the right knee. She did have an MRI scan performed recently, which did reveal a posterior horn tear of the medial meniscus, as well as full-thickness cartilage loss in both the medial and lateral joints as well as patellofemoral  joint. This was discussed with her. The different types of treatments, those of more conservative, that of knee arthroscopy to remove the medial meniscus, versus total knee replacement. She had thought about this and was concerned of the fact that if by doing a limited procedure such as an arthroscopic debridement that it would probably not be as beneficial. Therefore, she is leaning toward having total knee replacement. She apparently did have a knee injury back in February 2016 when she hadan injury with a twisting motion. She had tried ice and anti-inflammatories, but continuedto have right knee pain more on the lateral aspect. She had difficulty with her gait and she did have a corticosteroid injection, but it was not beneficial. That is the reason for the MRI scan. She has feelings of giving way, but has not exactly locked at this time. Certainly she does complain of crepitus as she is going up and down stairs. Again, she has failed corticosteroid injections  HOSPITAL COURSE:  [name] is a 64 y.o. admitted on 05/08/2015 and found to have a diagnosis of RIGHT  KNEE OSTEOARTHRITIS, MEDIAL MENISCUS TEAR.  After appropriate laboratory studies were obtained  they were taken to the operating room on 05/08/2015 and underwent  Procedure(s): TOTAL KNEE ARTHROPLASTY  Right.   They were given perioperative antibiotics:  Anti-infectives    Start     Dose/Rate Route Frequency Ordered Stop   05/08/15 1900  vancomycin (VANCOCIN) IVPB 1000 mg/200 mL premix     1,000 mg 200 mL/hr over 60 Minutes Intravenous Every 12 hours 05/08/15 1303 05/08/15 1920   05/08/15 0645  vancomycin (VANCOCIN) IVPB 1000 mg/200 mL premix     1,000 mg 200 mL/hr over 60 Minutes Intravenous To [facility] 05/07/15 1336 05/08/15 0812    .  Tolerated the procedure well.    Toradol was given post op.  POD #1, allowed out of bed to a chair.  PT for ambulation and exercise program.  IV saline locked.  O2  discontionued.  POD #2, continued PT and ambulation.   Hemovac pulled. . The remainder of the hospital course was dedicated to ambulation and strengthening.   The patient was discharged on 2 Days Post-Op in  Stable condition.  Blood products given:none  DIAGNOSTIC STUDIES: Recent vital signs: Patient Vitals for the past 24 hrs:  BP Temp Temp src Pulse Resp SpO2  05/10/15 0620 138/65 mmHg 99.5 F (37.5 C) Oral 86 18 91 %  05/09/15 2249 125/66 mmHg 98.5 F (36.9 C) Oral 87 18 98 %  05/09/15 1345 (!) 118/55 mmHg 98.2 F (36.8 C) Oral 74 18 94 %  05/09/15 1100 (!) 115/53 mmHg - - 63 - -       Recent laboratory studies:  Recent Labs  05/09/15 0308 05/10/15 0524  WBC 7.7 8.3  HGB 10.7* 10.4*  HCT 33.2* 33.1*  PLT 241 250    Recent Labs  05/09/15 0308 05/10/15 0524  NA 139 134*  K 4.7 4.7  CL 102 102  CO2 28 28  BUN 11 12  CREATININE 0.65 0.83  GLUCOSE 113* 118*  CALCIUM 8.9 8.3*   Lab Results  Component Value Date   INR 1.01 04/30/2015     Recent Radiographic Studies :  No results found.  DISCHARGE INSTRUCTIONS: Discharge Instructions    CPM    Complete by:  As directed   Continuous passive motion machine (CPM):      Use the CPM from 0 to 60 for 6-8 hours per day.      You may increase by 5-10 degrees per day.  You may break it up into 2 or 3 sessions per day.      Use CPM for 3-4  weeks or until you are told to stop.     Call MD / Call 911    Complete by:  As directed   If you experience chest pain or shortness of breath, CALL 911 and be transported to the hospital emergency room.  If you develope a fever above 101 F, pus (white drainage) or increased drainage or redness at the wound, or calf pain, call your surgeon's office.     Change dressing    Complete by:  As directed   DO NOT CHANGE YOUR DRESSING     Constipation Prevention    Complete by:  As directed   Drink plenty of fluids.  Prune juice may be helpful.  You may use a stool softener, such as  Colace (over the counter) 100 mg twice a day.  Use MiraLax (over the counter) for constipation as needed.  Diet general    Complete by:  As directed      Discharge instructions    Complete by:  As directed   [place] items at home which could result in a fall. This includes throw rugs or furniture in walking pathways ICE to the affected joint every three hours while awake for 30 minutes at a time, for at least the first 3-5 days, and then as needed for pain and swelling.  Continue to use ice for pain and swelling. You may notice swelling that will progress down to the foot and ankle.  This is normal after surgery.  Elevate your leg when you are not up walking on it.   Continue to use the breathing machine you got in the hospital (incentive spirometer) which will help keep your temperature down.  It is common for your temperature to cycle up and down following surgery, especially at night when you are not up moving around and exerting yourself.  The breathing machine keeps your lungs expanded and your temperature down.   DIET:  As you were doing prior to hospitalization, we recommend a well-balanced diet.  DRESSING / WOUND CARE / SHOWERING  Keep the surgical dressing until follow up.  The dressing is water proof, so you can shower without any extra covering.  IF THE DRESSING FALLS OFF or the wound gets wet inside, change the dressing with sterile gauze.  Please use good hand washing techniques before changing the dressing.  Do not use any lotions or creams on the incision until instructed by your surgeon.    ACTIVITY  Increase activity slowly as tolerated, but follow the weight bearing instructions below.   No driving for 6 weeks or until further direction given by your physician.  You cannot drive while taking narcotics.  No lifting or carrying greater than 10 lbs. until further directed by your surgeon. Avoid periods of inactivity such as sitting longer  than an hour when not asleep. This helps prevent blood clots.  You may return to work once you are authorized by your doctor.     WEIGHT BEARING   Partial weight bearing with assist device as directed.  50% of weight   EXERCISES  Results after joint replacement surgery are often greatly improved when you follow the exercise, range of motion and muscle strengthening exercises prescribed by your doctor. Safety measures are also important to protect the joint from further injury. Any time any of these exercises cause you to have increased pain or swelling, decrease what you are doing until you are comfortable again and then slowly increase them. If you have problems or questions, call your caregiver or physical therapist for advice.   Rehabilitation is important following a joint replacement. After just a few days of immobilization, the muscles of the leg can become weakened and shrink (atrophy).  These exercises are designed to build up the tone and strength of the thigh and leg muscles and to improve motion. Often times heat used for twenty to thirty minutes before working out will loosen up your tissues and help with improving the range of motion but do not use heat for the first two weeks following surgery (sometimes heat can increase post-operative swelling).   These exercises can be done on a training (exercise) mat, on the floor, on a table or on a bed. Use whatever works the best and is most comfortable for you.    Use music or television while you are  exercising so that the exercises are a pleasant break in your day. This will make your life better with the exercises acting as a break in your routine that you can look forward to.   Perform all exercises about fifteen times, three times per day or as directed.  You should exercise both the operative leg and the other leg as well.   Exercises include:   Quad Sets - Tighten up the muscle on the front of the thigh (Quad) and hold for 5-10  seconds.   Straight Leg Raises - With your knee straight (if you were given a brace, keep it on), lift the leg to 60 degrees, hold for 3 seconds, and slowly lower the leg.  Perform this exercise against resistance later as your leg gets stronger.  Leg Slides: Lying on your back, slowly slide your foot toward your buttocks, bending your knee up off the floor (only go as far as is comfortable). Then slowly slide your foot back down until your leg is flat on the floor again.  Angel Wings: Lying on your back spread your legs to the side as far apart as you can without causing discomfort.  Hamstring Strength:  Lying on your back, push your heel against the floor with your leg straight by tightening up the muscles of your buttocks.  Repeat, but this time bend your knee to a comfortable angle, and push your heel against the floor.  You may put a pillow under the heel to make it more comfortable if necessary.   A rehabilitation program following joint replacement surgery can speed recovery and prevent re-injury in the future due to weakened muscles. Contact your doctor or a physical therapist for more information on knee rehabilitation.    CONSTIPATION  Constipation is defined medically as fewer than three stools per week and severe constipation as less than one stool per week.  Even if you have a regular bowel pattern at home, your normal regimen is likely to be disrupted due to multiple reasons following surgery.  Combination of anesthesia, postoperative narcotics, change in appetite and fluid intake all can affect your bowels.   YOU MUST use at least one of the following options; they are listed in order of increasing strength to get the job done.  They are all available over the counter, and you may need to use some, POSSIBLY even all of these options:    Drink plenty of fluids (prune juice may be helpful) and high fiber foods Colace 100 mg by mouth twice a day  Senokot for constipation as directed and  as needed Dulcolax (bisacodyl), take with full glass of water  Miralax (polyethylene glycol) once or twice a day as needed.  If you have tried all these things and are unable to have a bowel movement in the first 3-4 days after surgery call either your surgeon or your primary doctor.    If you experience loose stools or diarrhea, hold the medications until you stool forms back up.  If your symptoms do not get better within 1 week or if they get worse, check with your doctor.  If you experience "the worst abdominal pain ever" or develop nausea or vomiting, please contact the office immediately for further recommendations for treatment.   ITCHING:  If you experience itching with your medications, try taking only a single pain pill, or even half a pain pill at a time.  You can also use Benadryl over the counter for itching or also to help  with sleep.   TED HOSE STOCKINGS:  Use stockings on both legs until for at least 2 weeks or as directed by physician office. They may be removed at night for sleeping.  MEDICATIONS:  See your medication summary on the "After Visit Summary" that nursing will review with you.  You may have some home medications which will be placed on hold until you complete the course of blood thinner medication.  It is important for you to complete the blood thinner medication as prescribed.  PRECAUTIONS:  If you experience chest pain or shortness of breath - call 911 immediately for transfer to the hospital emergency department.   If you develop a fever greater that 101 F, purulent drainage from wound, increased redness or drainage from wound, foul odor from the wound/dressing, or calf pain - CONTACT YOUR SURGEON.                                                   FOLLOW-UP APPOINTMENTS:  If you do not already have a post-op appointment, please call the office for an appointment to be seen by your surgeon.  Guidelines for how soon to be seen are listed in your "After Visit Summary",  but are typically between 1-4 weeks after surgery.  OTHER INSTRUCTIONS:   Knee Replacement:  Do not place pillow under knee, focus on keeping the knee straight while resting. CPM instructions: 0-90 degrees, 2 hours in the morning, 2 hours in the afternoon, and 2 hours in the evening. Place foam block, curve side up under heel at all times except when in CPM or when walking.  DO NOT modify, tear, cut, or change the foam block in any way.  MAKE SURE YOU:  Understand these instructions.  Get help right away if you are not doing well or get worse.    Thank you for letting us be a part of your medical care team.  It is a privilege we respect greatly.  We hope these instructions will help you stay on track for a fast and full recovery!     Do not put a pillow under the knee. Place it under the heel.    Complete by:  As directed      Driving restrictions    Complete by:  As directed   No driving for 6 weeks     Increase activity slowly as tolerated    Complete by:  As directed      Lifting restrictions    Complete by:  As directed   No lifting for 6 weeks     Partial weight bearing    Complete by:  As directed   % Body Weight:  50%  Laterality:  right  Extremity:  Lower     Patient may shower    Complete by:  As directed   You may shower over the brown dressing     TED hose    Complete by:  As directed   Use stockings (TED hose) for 2-3 weeks on right leg.  You may remove them at night for sleeping.           DISCHARGE MEDICATIONS:     Medication List    STOP taking these medications        ALEVE 220 MG tablet  Generic drug:  naproxen sodium  diclofenac sodium 1 % Gel  Commonly known as:  VOLTAREN     GLUCOSAMINE HCL PO     OVER THE COUNTER MEDICATION     traMADol 50 MG tablet  Commonly known as:  ULTRAM      TAKE these medications        acetaminophen 500 MG tablet  Commonly known as:  TYLENOL  Take 1,000 mg by mouth every 6 (six) hours as needed. Pain      albuterol 108 (90 BASE) MCG/ACT inhaler  Commonly known as:  PROVENTIL HFA;VENTOLIN HFA  Inhale 2 puffs into the lungs every 4 (four) hours as needed. Shortness of breath     alendronate 70 MG tablet  Commonly known as:  FOSAMAX  Take 70 mg by mouth once a week. Take with a full glass of water on an empty stomach.     b complex vitamins tablet  Take 1 tablet by mouth daily.     CALCIUM MAGNESIUM PO  Take 1 tablet by mouth daily.     calcium-vitamin D 500-200 MG-UNIT tablet  Commonly known as:  OSCAL WITH D  Take 1 tablet by mouth daily.     cholecalciferol 1000 UNITS tablet  Commonly known as:  VITAMIN D  Take 1,000 Units by mouth daily.     DULoxetine 30 MG capsule  Commonly known as:  CYMBALTA  Take 30 mg by mouth daily.     EPINEPHrine 0.3 mg/0.3 mL Devi  Commonly known as:  EPI-PEN  Inject 0.3 mg into the muscle once.     fluticasone 50 MCG/ACT nasal spray  Commonly known as:  FLONASE  Place 2 sprays into the nose daily.     gabapentin 300 MG capsule  Commonly known as:  NEURONTIN  Take 600 mg by mouth 3 (three) times daily.     HYDROmorphone 2 MG tablet  Commonly known as:  DILAUDID  Take 1-2 tablets (2-4 mg total) by mouth every 4 (four) hours as needed for severe pain.     methocarbamol 500 MG tablet  Commonly known as:  ROBAXIN  Take 1 tablet (500 mg total) by mouth every 8 (eight) hours as needed for muscle spasms.     montelukast 10 MG tablet  Commonly known as:  SINGULAIR  Take 10 mg by mouth at bedtime.     multivitamins ther. w/minerals Tabs tablet  Take 1 tablet by mouth daily.     rivaroxaban 10 MG Tabs tablet  Commonly known as:  XARELTO  Take 1 tablet (10 mg total) by mouth daily with breakfast.     vitamin C 500 MG tablet  Commonly known as:  ASCORBIC ACID  Take 2,000 mg by mouth daily.        FOLLOW UP VISIT:       Follow-up Information    Follow up with [facility].   Why:  They will contact you to schedule home therapy  visits.   Contact information:   [address] [place] [address] [phone]       Follow up with Garald Balding, MD. Schedule an appointment as soon as possible for a visit on 05/21/2015.   Specialty:  Orthopedic Surgery   Contact information:   [place] [place] [address] [phone]       DISPOSITION:   Home  CONDITION:  Stable   Mike Craze. [place], [place] [phone]  05/10/2015 9:35 AM

## 2015-05-10 NOTE — Progress Notes (Signed)
Physical Therapy Treatment Patient Details Name: [name] MRN: [id number] DOB: August 17, 1950 Today's Date: 05/10/2015    History of Present Illness 64 y.o. female s/p Rt TKA. PMH: vertigo, chronic back pain, osteopenia, anxiety, panic attacks.     PT Comments    Patient is making good progress with PT.  From a mobility standpoint anticipate patient will be ready for DC home with family assistance. Patient denies any questions or concerns following session.   .     Follow Up Recommendations  Home health PT;Supervision for mobility/OOB     Equipment Recommendations  None recommended by PT    Recommendations for Other Services       Precautions / Restrictions Precautions Precautions: Knee Precaution Booklet Issued: No Restrictions Weight Bearing Restrictions: Yes RLE Weight Bearing: Partial weight bearing RLE Partial Weight Bearing Percentage or Pounds: 50    Mobility  Bed Mobility Overal bed mobility: Needs Assistance Bed Mobility: Supine to Sit     Supine to sit: Mod assist (Rt LE)     General bed mobility comments: reports feeling confident with mobility, will have assistance available at home.   Transfers Overall transfer level: Needs assistance Equipment used: Rolling walker (2 wheeled) Transfers: Sit to/from Stand Sit to Stand: Supervision         General transfer comment: safe technique  Ambulation/Gait Ambulation/Gait assistance: Supervision Ambulation Distance (Feet): 125 Feet Assistive device: Rolling walker (2 wheeled) Gait Pattern/deviations: Step-to pattern Gait velocity: decreased   General Gait Details: maintaining 50% WB during ambulation   Stairs Stairs:  (declined, has ramp at home)          Wheelchair Mobility    Modified Rankin (Stroke Patients Only)       Balance Overall balance assessment: Needs assistance Sitting-balance support: No upper extremity supported Sitting balance-Leahy Scale: Good     Standing  balance support: During functional activity Standing balance-Leahy Scale: Fair Standing balance comment: using rw                    Cognition Arousal/Alertness: Awake/alert Behavior During Therapy: WFL for tasks assessed/performed Overall Cognitive Status: Within Functional Limits for tasks assessed                      Exercises Total Joint Exercises Ankle Circles/Pumps: AROM;10 reps;Both Quad Sets: Strengthening;Right;10 reps Short Arc Quad: Strengthening;Right;10 reps (mod assist) Heel Slides: AAROM;Right;10 reps Hip ABduction/ADduction: Strengthening;Right;10 reps Straight Leg Raises: Strengthening;Right;5 reps;Other (comment) (mod assist) Knee Flexion: AROM;Right;10 reps;Seated Goniometric ROM: 81 degrees flexion seated    General Comments        Pertinent Vitals/Pain Pain Assessment: 0-10 Pain Score: 3  Faces Pain Scale: Hurts a little bit Pain Location: Rt knee, thigh Pain Descriptors / Indicators: Sore Pain Intervention(s): Limited activity within patient's tolerance;Monitored during session;Ice applied    Home Living                      Prior Function            PT Goals (current goals can now be found in the care plan section) Acute Rehab PT Goals Patient Stated Goal: go home today PT Goal Formulation: With patient Time For Goal Achievement: 05/22/15 Potential to Achieve Goals: Good Progress towards PT goals: Progressing toward goals    Frequency  7X/week    PT Plan Current plan remains appropriate    Co-evaluation  End of Session Equipment Utilized During Treatment: Gait belt Activity Tolerance: Patient tolerated treatment well Patient left: in chair;with call bell/phone within reach;Other (comment) (in knee extension)     Time: 1025-8527 PT Time Calculation (min) (ACUTE ONLY): 27 min  Charges:  $Gait Training: 8-22 mins $Therapeutic Exercise: 8-22 mins                    G Codes:      Cassell Clement, PT, CSCS Pager [phone] Office [phone]  05/10/2015, 12:21 PM

## 2015-05-10 NOTE — Progress Notes (Signed)
Occupational Therapy Treatment Patient Details Name: [name] MRN: [id number] DOB: Jul 23, 1950 Today's Date: 05/10/2015    History of present illness 64 y.o. female s/p Rt TKA. PMH: vertigo, chronic back pain, osteopenia, anxiety, panic attacks.    OT comments  Pt making good progress toward OT goals. Educated pt on tub transfer technique using 3 in 1; pt demonstrated understanding with min assist to manage RLE. Pt supervision overall for functional mobility at this time. D/c plan remains appropriate at this time. Pt ready to d/c home from an OT standpoint. Continue to follow pt acutely.    Follow Up Recommendations  No OT follow up;Supervision - Intermittent    Equipment Recommendations  None recommended by OT    Recommendations for Other Services      Precautions / Restrictions Precautions Precautions: Fall;Knee Precaution Booklet Issued: No Restrictions Weight Bearing Restrictions: Yes RLE Weight Bearing: Partial weight bearing RLE Partial Weight Bearing Percentage or Pounds: 50       Mobility Bed Mobility               General bed mobility comments: in recliner, returned to recliner at end of session  Transfers Overall transfer level: Needs assistance Equipment used: Rolling walker (2 wheeled) Transfers: Sit to/from Stand Sit to Stand: Supervision         General transfer comment: Supervision for safety. Good hand placement and technique. Sit <> stand from chair x 2, BSC x 2     Balance Overall balance assessment: Needs assistance Sitting-balance support: No upper extremity supported Sitting balance-Leahy Scale: Good     Standing balance support: No upper extremity supported Standing balance-Leahy Scale: Fair Standing balance comment: Pt able to wash hands at sink without UE support                   ADL Overall ADL's : Needs assistance/impaired     Grooming: Wash/dry hands;Supervision/safety;Standing                    Toilet Transfer: Supervision/safety;Ambulation;BSC;RW (BSC over toilet)   Toileting- Clothing Manipulation and Hygiene: Supervision/safety;Sit to/from stand   Tub/ Shower Transfer: Minimal assistance;Ambulation;3 in 1;Rolling walker;Walk-in Lobbyist Details (indicate cue type and reason): Min A to manage R LE Functional mobility during ADLs: Supervision/safety;Rolling walker General ADL Comments: No family present during OT session. Educated pt on tub transfer technique with 3 in 1; pt demonstrated understanding.      Vision                     Perception     Praxis      Cognition   Behavior During Therapy: WFL for tasks assessed/performed Overall Cognitive Status: Within Functional Limits for tasks assessed                       Extremity/Trunk Assessment               Exercises     Shoulder Instructions       General Comments      Pertinent Vitals/ Pain       Pain Assessment: Faces Faces Pain Scale: Hurts a little bit Pain Location: R knee Pain Intervention(s): Limited activity within patient's tolerance;Monitored during session;Ice applied  Home Living  Prior Functioning/Environment              Frequency Min 2X/week     Progress Toward Goals  OT Goals(current goals can now be found in the care plan section)  Progress towards OT goals: Progressing toward goals  Acute Rehab OT Goals Patient Stated Goal: be able to get back home OT Goal Formulation: With patient  Plan Discharge plan remains appropriate    Co-evaluation                 End of Session Equipment Utilized During Treatment: Rolling walker CPM Right Knee Additional Comments: trapeze helper   Activity Tolerance Patient tolerated treatment well   Patient Left in chair;with call bell/phone within reach   Nurse Communication          Time: 5374-8270 OT Time Calculation  (min): 28 min  Charges: OT General Charges $OT Visit: 1 Procedure OT Treatments $Self Care/Home Management : 23-37 mins  Binnie Kand M.S., OTR/L Pager: [phone]  05/10/2015, 11:30 AM

## 2015-05-24 ENCOUNTER — Telehealth: Payer: Self-pay | Admitting: Physical Therapy

## 2015-05-24 ENCOUNTER — Ambulatory Visit: Payer: Medicare Other | Attending: Orthopaedic Surgery | Admitting: Physical Therapy

## 2015-05-24 DIAGNOSIS — Z96651 Presence of right artificial knee joint: Secondary | ICD-10-CM | POA: Diagnosis present

## 2015-05-24 DIAGNOSIS — M25661 Stiffness of right knee, not elsewhere classified: Secondary | ICD-10-CM | POA: Diagnosis present

## 2015-05-24 DIAGNOSIS — R269 Unspecified abnormalities of gait and mobility: Secondary | ICD-10-CM

## 2015-05-24 DIAGNOSIS — R29898 Other symptoms and signs involving the musculoskeletal system: Secondary | ICD-10-CM | POA: Diagnosis present

## 2015-05-24 NOTE — Telephone Encounter (Signed)
Called patient to schedule future appointments at 2x/week frequency. Had to leave message for patient to return my call.

## 2015-05-24 NOTE — Therapy (Signed)
[name], [place], [address] Phone: [phone]   Fax:  [phone]  Physical Therapy Evaluation  Patient Details  Name: [name] MRN: [id number] Date of Birth: February 26, 1951 Referring Provider: Dr. Durward Fortes  Encounter Date: 05/24/2015      PT End of Session - 05/24/15 1217    Visit Number 1   Number of Visits 12   Date for PT Re-Evaluation 07/05/15   Authorization Type BCBS Medicare G codes; 10th visit progress note   PT Start Time 1102   PT Stop Time 1153   PT Time Calculation (min) 51 min   Activity Tolerance Patient limited by pain;Patient limited by fatigue;Treatment limited secondary to medical complications (Comment)      Past Medical History  Diagnosis Date  . Arthritis   . Fibromyalgia   . Depression     takes Cymbalta daily  . PONV (postoperative nausea and vomiting)   . Family history of adverse reaction to anesthesia     mom wakes up fighting and sick  . Bruises easily   . History of bronchitis early Aug 2016  . Vertigo   . Weakness     in hands  . Joint pain   . Joint swelling   . Chronic back pain     DDD  . Osteoarthritis   . Osteopenia   . Dry skin   . GERD (gastroesophageal reflux disease)     occasionally but will take Tums if needed  . IBS (irritable bowel syndrome)   . Urinary frequency   . Urinary urgency   . Cataracts, bilateral     immature  . Glaucoma   . Anxiety   . Panic attacks   . Insomnia   . History of shingles     Past Surgical History  Procedure Laterality Date  . Foot surgery Right   . Abdominal hysterectomy  2007    with bladder sling  . Leg surgery Right   . Esophagogastroduodenoscopy    . Colonoscopy    . Total knee arthroplasty Right 05/08/2015  . Total knee arthroplasty Right 05/08/2015    Procedure: TOTAL KNEE ARTHROPLASTY;  Surgeon: Garald Balding, MD;  Location: [facility];  Service: Orthopedics;  Laterality: Right;    There  were no vitals filed for this visit.  Visit Diagnosis:  Status post total right knee replacement - Plan: PT plan of care cert/re-cert  Knee stiff, right - Plan: PT plan of care cert/re-cert  Weakness of right lower extremity - Plan: PT plan of care cert/re-cert  Abnormality of gait - Plan: PT plan of care cert/re-cert      Subjective Assessment - 05/24/15 1110    Subjective PMH significant for fibromyalgia does water 4x/week (hopes to return to the pool 11/21;  Right TKR 10/25 normal hospitalization;  5 visits HHPT with [facility]; using SPC;   needs left TKR in future   Pertinent History fibromyalgia with low stamina   Limitations Walking;Standing   How long can you sit comfortably? no problem for the most part 30 min   How long can you walk comfortably? up and down 1/2 driveway   Diagnostic tests x-rays   Patient Stated Goals get back to the pool in 2-3 weeks   Currently in Pain? Yes   Pain Score 5    Pain Location Knee   Pain Orientation Right   Pain Type Surgical pain   Pain Onset 1 to 4 weeks ago   Pain  Frequency Constant   Aggravating Factors  walking   Pain Relieving Factors heat and ice; pain medication            [facility] PT Assessment - 05/24/15 1118    Assessment   Medical Diagnosis Right TKR   Referring Provider Dr. Durward Fortes   Onset Date/Surgical Date 05/08/15   Hand Dominance Right   Next MD Visit 06/04/15   Prior Therapy no   Precautions   Precautions None   Restrictions   Weight Bearing Restrictions No   Balance Screen   Has the patient fallen in the past 6 months Yes   How many times? 3 or 4  before surgery;  dizziness/vertigo   Has the patient had a decrease in activity level because of a fear of falling?  Yes   Is the patient reluctant to leave their home because of a fear of falling?  No   Home Ecologist residence   Living Arrangements Spouse/significant other   Available Help at Discharge Family   Type of [place] entrance   [place] One level   [place] - single point   Prior Function   Level of Stallings On disability   Leisure gardening, reading, sewing   Observation/Other Assessments   Observations 2 cm open area just above    Focus on Therapeutic Outcomes (FOTO)  54% limited   Posture/Postural Control   Posture Comments moderate right knee swelling   AROM   Right Knee Extension 20  supine 5   Right Knee Flexion 95  110 in supine   Left Knee Flexion 150   Strength   Right Knee Flexion 3/5   Right Knee Extension 3-/5   Flexibility   Soft Tissue Assessment /Muscle Length yes   Hamstrings WNLs   Palpation   Patella mobility decreased   Palpation comment hardness superior to incision                           PT Education - 05/24/15 1216    Education provided Yes   Education Details instructed to call Md regarding incision opening with drainage; edema control; weight bearing exercises   Methods Explanation;Demonstration;Handout   Comprehension Verbalized understanding          PT Short Term Goals - 05/24/15 1232    PT SHORT TERM GOAL #1   Title The patient will be demonstrated knowledge of edema control methods for home 06/14/15   Time 3   Period Weeks   Status New   PT SHORT TERM GOAL #2   Title The patient will have knee flexion AROM to 115 degrees needed for greater ease with sit to stand   Time 3   Period Weeks   Status New   PT SHORT TERM GOAL #3   Title Knee extension against gravity to 10 degrees (sitting).     Time 3   Period Weeks   Status New           PT Long Term Goals - 05/24/15 1238    PT LONG TERM GOAL #1   Title The patient will be able to return to previous ex program in the pool including knee specific ROM and strengthening exercises.  07/05/15   Time 6   Period Weeks   Status New   PT LONG TERM GOAL #2   Title The patient will  have knee AROM 3-118 degrees  needed for gardening and household chores  07/05/15   Time 6   Period Weeks   Status New   PT LONG TERM GOAL #3   Title Quad and HS strength 3+/5 to 4-/5 needed for greater ease with sit to stand and getting in/out of the car   07/05/15   Time 6   Period Weeks   Status New   PT LONG TERM GOAL #4   Title FOTO functional outcome score improved to 39% indicating improved  function with less pain  07/05/15   Time 6   Period Weeks   Status New   PT LONG TERM GOAL #5   Title Patient will be able to walk > 300 feet with cane for grocery shopping   Time [place] - 06/23/2015 1218    Clinical Impression Statement The patient presents s/p right TKR on 05/08/15.  She reports a typical hospitalization and HHPT.  Her medical history is significant for fibromyalgia which is easily exacerbated as well as left knee OA.  She presents with a [facility] which she uses full time.  She is unable to walk community distances yet.  Her goal is to return to the pool as soon as possible (after next MD appt) which she uses to manage her fibromyalgia.  She does have an open area approx 2 cm superior incision with clearish yellow small amount of drainage.  She will call to doctor's office regarding this area.  No redness or increased skin temp.  Seated right knee AROM 20-95, supine 5-110 degrees.  Decreased quad activation with quad lag with SLR and LAQ.  Quads 3-/5, HS 3/5.  She would benefit from PT to address these deficits.     Pt will benefit from skilled therapeutic intervention in order to improve on the following deficits Abnormal gait;Decreased activity tolerance;Decreased strength;Decreased range of motion;Pain;Difficulty walking;Increased edema   Rehab Potential Good   Clinical Impairments Affecting Rehab Potential Fibromyalgia easily exacerbated   PT Frequency 2x / week   PT Duration 6 weeks   PT Treatment/Interventions ADLs/Self Care Home  Management;Cryotherapy;Advice worker;Therapeutic exercise;Patient/family education;Manual techniques;Taping;Vasopneumatic Device   PT Next Visit Plan Limit reps of ex secondary fibromyalgia;  Nu-step 1-2 min only; standing weight shifting; quad activation;  AROM;   vasocompression          G-Codes - 06-23-2015 1245    Functional Assessment Tool Used FOTO clinical judgement   Functional Limitation Mobility: Walking and moving around   Mobility: Walking and Moving Around Current Status [id number]) At least 40 percent but less than 60 percent impaired, limited or restricted   Mobility: Walking and Moving Around Goal Status [phone]) At least 20 percent but less than 40 percent impaired, limited or restricted       Problem List Patient Active Problem List   Diagnosis Date Noted  . Osteopenia 05/10/2015  . Depression 05/10/2015  . Primary osteoarthritis of right knee 05/08/2015  . Primary osteoarthritis of knee 05/08/2015  . Fibromyalgia     Alvera Singh Jun 23, 2015, 12:47 PM  [place], [place], [address] Phone: [phone]   Fax:  [phone]  Name: BRENDER HEWGLEY MRN: [id number] Date of Birth: [birth year]   Ruben Im, PT 06-23-15 12:48 PM Phone: [phone] Fax: [phone]

## 2015-05-25 ENCOUNTER — Ambulatory Visit: Payer: Medicare Other | Admitting: Physical Therapy

## 2015-05-25 DIAGNOSIS — R269 Unspecified abnormalities of gait and mobility: Secondary | ICD-10-CM

## 2015-05-25 DIAGNOSIS — Z96651 Presence of right artificial knee joint: Secondary | ICD-10-CM

## 2015-05-25 DIAGNOSIS — M25661 Stiffness of right knee, not elsewhere classified: Secondary | ICD-10-CM

## 2015-05-25 DIAGNOSIS — R29898 Other symptoms and signs involving the musculoskeletal system: Secondary | ICD-10-CM

## 2015-05-25 NOTE — Therapy (Signed)
[place], [place], [address] Phone: [phone]   Fax:  [phone]  Physical Therapy Treatment  Patient Details  Name: [name] MRN: [id number] Date of Birth: 1950/09/14 Referring Provider: Dr. Durward Fortes  Encounter Date: 05/25/2015      PT End of Session - 05/25/15 1044    Visit Number 2   Number of Visits 12   Date for PT Re-Evaluation 07/05/15   PT Start Time [id number]   PT Stop Time 1115   PT Time Calculation (min) 60 min      Past Medical History  Diagnosis Date  . Arthritis   . Fibromyalgia   . Depression     takes Cymbalta daily  . PONV (postoperative nausea and vomiting)   . Family history of adverse reaction to anesthesia     mom wakes up fighting and sick  . Bruises easily   . History of bronchitis early Aug 2016  . Vertigo   . Weakness     in hands  . Joint pain   . Joint swelling   . Chronic back pain     DDD  . Osteoarthritis   . Osteopenia   . Dry skin   . GERD (gastroesophageal reflux disease)     occasionally but will take Tums if needed  . IBS (irritable bowel syndrome)   . Urinary frequency   . Urinary urgency   . Cataracts, bilateral     immature  . Glaucoma   . Anxiety   . Panic attacks   . Insomnia   . History of shingles     Past Surgical History  Procedure Laterality Date  . Foot surgery Right   . Abdominal hysterectomy  2007    with bladder sling  . Leg surgery Right   . Esophagogastroduodenoscopy    . Colonoscopy    . Total knee arthroplasty Right 05/08/2015  . Total knee arthroplasty Right 05/08/2015    Procedure: TOTAL KNEE ARTHROPLASTY;  Surgeon: Garald Balding, MD;  Location: [facility];  Service: Orthopedics;  Laterality: Right;    There were no vitals filed for this visit.  Visit Diagnosis:  Status post total right knee replacement  Knee stiff, right  Weakness of right lower extremity  Abnormality of gait      Subjective  Assessment - 05/25/15 1020    Subjective R knee in pain today from cab ride. Wants to get back to the pool its warm water [facility] the senior [facility].   Pain Score 7    Pain Location Knee                        [facility] Adult PT Treatment/Exercise - 05/25/15 1130    Knee/Hip Exercises: Aerobic   Nustep L3 4 minutes  with rest break at 2 minutes   Knee/Hip Exercises: Seated   Long Arc Quad Strengthening;2 sets;5 reps   Heel Slides AROM;2 sets;20 reps   Marching Strengthening;2 sets;10 reps   Knee/Hip Exercises: Supine   Quad Sets Strengthening;2 sets;10 reps   [employer] Limitations 10 second holds   Short Loss adjuster, chartered sets;10 reps   Heel Slides Strengthening;1 set;10 reps   Leisure centre manager IFC   Electrical Stimulation Parameters to tolerance 15 min   Electrical Stimulation Goals Pain   Vasopneumatic   Number Minutes Vasopneumatic  15 minutes   Vasopnuematic Location  Knee   Vasopneumatic Pressure Medium   Vasopneumatic Temperature  32                PT Short Term Goals - 05/24/15 1232    PT SHORT TERM GOAL #1   Title The patient will be demonstrated knowledge of edema control methods for home 06/14/15   Time 3   Period Weeks   Status New   PT SHORT TERM GOAL #2   Title The patient will have knee flexion AROM to 115 degrees needed for greater ease with sit to stand   Time 3   Period Weeks   Status New   PT SHORT TERM GOAL #3   Title Knee extension against gravity to 10 degrees (sitting).     Time 3   Period Weeks   Status New           PT Long Term Goals - 05/24/15 1238    PT LONG TERM GOAL #1   Title The patient will be able to return to previous ex program in the pool including knee specific ROM and strengthening exercises.  07/05/15   Time 6   Period Weeks   Status New   PT LONG TERM GOAL #2   Title The patient will have knee AROM 3-118 degrees  needed for gardening and household chores  07/05/15   Time 6   Period Weeks   Status New   PT LONG TERM GOAL #3   Title Quad and HS strength 3+/5 to 4-/5 needed for greater ease with sit to stand and getting in/out of the car   07/05/15   Time 6   Period Weeks   Status New   PT LONG TERM GOAL #4   Title FOTO functional outcome score improved to 39% indicating improved  function with less pain  07/05/15   Time 6   Period Weeks   Status New   PT LONG TERM GOAL #5   Title Patient will be able to walk > 300 feet with cane for grocery shopping   Time 6   Period Weeks   Status New               Plan - 05/25/15 1025    Clinical Impression Statement Patient presents with pain exasterated by high reps so reps were monitored during strengthening to keep pain at a managemable level. treatment was focused on increase exercise tolerance and quad strengthening in sitting and supine. Trial of IFC and Vaso with pt reporting no immediate relief   Clinical Impairments Affecting Rehab Potential Fibromyalgia easily exacerbated   PT Next Visit Plan Assess benefit if IFC/Vaso. Limit reps of ex secondary fibromyalgia;  Nu-step 2-3 min only; standing weight shifting; quad activation;  AROM;   vasocompression       Problem List Patient Active Problem List   Diagnosis Date Noted  . Osteopenia 05/10/2015  . Depression 05/10/2015  . Primary osteoarthritis of right knee 05/08/2015  . Primary osteoarthritis of knee 05/08/2015  . Fibromyalgia    Laury [place], [place] 05/25/2015 11:34 AM [phone] [facility] [place], [place], [address] Phone: [phone]   Fax:  [phone]  Name: [name] MRN: [id number] Date of Birth: 03-08-1951

## 2015-05-25 NOTE — Telephone Encounter (Signed)
Patient present for appointment on 11/11. Was able to schedule at that time.

## 2015-05-29 ENCOUNTER — Ambulatory Visit: Payer: Medicare Other | Admitting: Physical Therapy

## 2015-05-29 DIAGNOSIS — M25661 Stiffness of right knee, not elsewhere classified: Secondary | ICD-10-CM

## 2015-05-29 DIAGNOSIS — R29898 Other symptoms and signs involving the musculoskeletal system: Secondary | ICD-10-CM

## 2015-05-29 DIAGNOSIS — Z96651 Presence of right artificial knee joint: Secondary | ICD-10-CM

## 2015-05-29 DIAGNOSIS — R269 Unspecified abnormalities of gait and mobility: Secondary | ICD-10-CM

## 2015-05-29 NOTE — Therapy (Signed)
[place], [place], [address] Phone: [phone]   Fax:  [phone]  Physical Therapy Treatment  Patient Details  Name: [name] MRN: [id number] Date of Birth: May 27, 1951 Referring Provider: Dr. Durward Fortes  Encounter Date: 05/29/2015      PT End of Session - 05/29/15 1509    Visit Number 3   Number of Visits 12   Date for PT Re-Evaluation 07/05/15   Authorization Type BCBS Medicare G codes; 10th visit progress note   PT Start Time 1330   PT Stop Time 1425   PT Time Calculation (min) 55 min   Behavior During Therapy [facility] for tasks assessed/performed      Past Medical History  Diagnosis Date  . Arthritis   . Fibromyalgia   . Depression     takes Cymbalta daily  . PONV (postoperative nausea and vomiting)   . Family history of adverse reaction to anesthesia     mom wakes up fighting and sick  . Bruises easily   . History of bronchitis early Aug 2016  . Vertigo   . Weakness     in hands  . Joint pain   . Joint swelling   . Chronic back pain     DDD  . Osteoarthritis   . Osteopenia   . Dry skin   . GERD (gastroesophageal reflux disease)     occasionally but will take Tums if needed  . IBS (irritable bowel syndrome)   . Urinary frequency   . Urinary urgency   . Cataracts, bilateral     immature  . Glaucoma   . Anxiety   . Panic attacks   . Insomnia   . History of shingles     Past Surgical History  Procedure Laterality Date  . Foot surgery Right   . Abdominal hysterectomy  2007    with bladder sling  . Leg surgery Right   . Esophagogastroduodenoscopy    . Colonoscopy    . Total knee arthroplasty Right 05/08/2015  . Total knee arthroplasty Right 05/08/2015    Procedure: TOTAL KNEE ARTHROPLASTY;  Surgeon: Garald Balding, MD;  Location: [place];  Service: Orthopedics;  Laterality: Right;    There were no vitals filed for this visit.  Visit Diagnosis:  Status post total  right knee replacement  Knee stiff, right  Weakness of right lower extremity  Abnormality of gait      Subjective Assessment - 05/29/15 1336    Subjective "I am feeling sore in my knee today and want to know if I can get a handout for strengthening"   Currently in Pain? Yes   Pain Location Knee   Pain Orientation Right   Pain Descriptors / Indicators Aching;Discomfort   Pain Onset 1 to 4 weeks ago   Pain Frequency Constant   Aggravating Factors  walking   Pain Relieving Factors heat and ice; pain medication            [facility] PT Assessment - 05/29/15 1345    AROM   Right Knee Flexion 110                     [facility] Adult PT Treatment/Exercise - 05/29/15 1343    Knee/Hip Exercises: Aerobic   Stationary Bike L1 x 6 min   rocking back and forth   Knee/Hip Exercises: Supine   Quad Sets AROM;Strengthening;Right;1 set;10 reps   Short Arc [employer] Strengthening;2 sets;10 reps  Heel Slides Strengthening;1 set;10 reps   Straight Leg Raises AROM;AAROM;Right;2 sets;10 reps   Vasopneumatic   Number Minutes Vasopneumatic  15 minutes   Vasopnuematic Location  Knee   Vasopneumatic Pressure Medium   Vasopneumatic Temperature  32   Manual Therapy   Manual Therapy Joint mobilization;Soft tissue mobilization;Taping   Joint Mobilization grade 1-2 P>A tibiofemoral joint mobs   Soft tissue mobilization retrograde/antegrade massage of the R knee with the knee elevated  reported signifcant relief of pain.   Kinesiotex Edema;Create Space   Kinesiotix   Edema Right knee with tape around the incision area and not going over it.                PT Education - 05/29/15 1509    Education provided Yes   Education Details KT tape education    Person(s) Educated Patient   Methods Explanation   Comprehension Verbalized understanding          PT Short Term Goals - 05/29/15 1514    PT SHORT TERM GOAL #1   Title The patient will be demonstrated knowledge of edema  control methods for home 06/14/15   Time 3   Period Weeks   Status On-going   PT SHORT TERM GOAL #2   Title The patient will have knee flexion AROM to 115 degrees needed for greater ease with sit to stand   Time 3   Period Weeks   Status On-going   PT SHORT TERM GOAL #3   Title Knee extension against gravity to 10 degrees (sitting).     Time 3   Period Weeks   Status On-going           PT Long Term Goals - 05/24/15 1238    PT LONG TERM GOAL #1   Title The patient will be able to return to previous ex program in the pool including knee specific ROM and strengthening exercises.  07/05/15   Time 6   Period Weeks   Status New   PT LONG TERM GOAL #2   Title The patient will have knee AROM 3-118 degrees needed for gardening and household chores  07/05/15   Time 6   Period Weeks   Status New   PT LONG TERM GOAL #3   Title Quad and HS strength 3+/5 to 4-/5 needed for greater ease with sit to stand and getting in/out of the car   07/05/15   Time 6   Period Weeks   Status New   PT LONG TERM GOAL #4   Title FOTO functional outcome score improved to 39% indicating improved  function with less pain  07/05/15   Time 6   Period Weeks   Status New   PT LONG TERM GOAL #5   Title Patient will be able to walk > 300 feet with cane for grocery shopping   Time 6   Period Weeks   Status New               Plan - 05/29/15 1510    Clinical Impression Statement Bahati reports that she is doing much better after being told she needs to elevate her knee above her heart multiple times. She increased her knee flexion to 110. attempted flexion with pad between knee but she reported pain so halted execises. peformed KT  tape and she reported it felt like it was helping and she was able to perform and complete all exercises without complaint of pain.  she reported extreme diffiuclty  with pain with lasting effects so monitored pt reponse to pain throughout therapy.    PT Next Visit Plan monitor  for increased pain due to fibromyaliga, bike or Nustep; standing weight shifting; quad activation;  manual ROM and STM, vasocompression   PT Home Exercise Plan KT taping   Consulted and Agree with Plan of Care Patient        Problem List Patient Active Problem List   Diagnosis Date Noted  . Osteopenia 05/10/2015  . Depression 05/10/2015  . Primary osteoarthritis of right knee 05/08/2015  . Primary osteoarthritis of knee 05/08/2015  . Fibromyalgia    Starr Lake PT, DPT, LAT, ATC  05/29/2015  3:17 PM   [facility] [address] [place], [place], [address] Phone: [phone]   Fax:  [phone]  Name: CLARAMAE MCCAY MRN: [id number] Date of Birth: 1950-08-16

## 2015-05-31 ENCOUNTER — Ambulatory Visit: Payer: Medicare Other | Admitting: Physical Therapy

## 2015-05-31 DIAGNOSIS — R29898 Other symptoms and signs involving the musculoskeletal system: Secondary | ICD-10-CM

## 2015-05-31 DIAGNOSIS — Z96651 Presence of right artificial knee joint: Secondary | ICD-10-CM | POA: Diagnosis not present

## 2015-05-31 DIAGNOSIS — R269 Unspecified abnormalities of gait and mobility: Secondary | ICD-10-CM

## 2015-05-31 DIAGNOSIS — M25661 Stiffness of right knee, not elsewhere classified: Secondary | ICD-10-CM

## 2015-05-31 NOTE — Therapy (Signed)
[place], [place], [address] Phone: [phone]   Fax:  [phone]  Physical Therapy Treatment  Patient Details  Name: [name] MRN: [id number] Date of Birth: [birth year] Referring Provider: Dr. Durward Fortes  Encounter Date: 05/31/2015      PT End of Session - 05/31/15 1415    Visit Number 4   Number of Visits 12   Date for PT Re-Evaluation 07/05/15   Authorization Type BCBS Medicare G codes; 10th visit progress note   PT Start Time 1338   PT Stop Time 1428   PT Time Calculation (min) 50 min   Activity Tolerance Patient tolerated treatment well   Behavior During Therapy [facility] for tasks assessed/performed      Past Medical History  Diagnosis Date  . Arthritis   . Fibromyalgia   . Depression     takes Cymbalta daily  . PONV (postoperative nausea and vomiting)   . Family history of adverse reaction to anesthesia     mom wakes up fighting and sick  . Bruises easily   . History of bronchitis early Aug 2016  . Vertigo   . Weakness     in hands  . Joint pain   . Joint swelling   . Chronic back pain     DDD  . Osteoarthritis   . Osteopenia   . Dry skin   . GERD (gastroesophageal reflux disease)     occasionally but will take Tums if needed  . IBS (irritable bowel syndrome)   . Urinary frequency   . Urinary urgency   . Cataracts, bilateral     immature  . Glaucoma   . Anxiety   . Panic attacks   . Insomnia   . History of shingles     Past Surgical History  Procedure Laterality Date  . Foot surgery Right   . Abdominal hysterectomy  2007    with bladder sling  . Leg surgery Right   . Esophagogastroduodenoscopy    . Colonoscopy    . Total knee arthroplasty Right 05/08/2015  . Total knee arthroplasty Right 05/08/2015    Procedure: TOTAL KNEE ARTHROPLASTY;  Surgeon: Garald Balding, MD;  Location: [place];  Service: Orthopedics;  Laterality: Right;    There were no vitals filed  for this visit.  Visit Diagnosis:  Status post total right knee replacement  Knee stiff, right  Weakness of right lower extremity  Abnormality of gait      Subjective Assessment - 05/31/15 1340    Subjective "I feel like the swelling is down and that the tape really helped"   Currently in Pain? Yes   Pain Score 2    Pain Location Knee   Pain Orientation Right   Pain Descriptors / Indicators Aching;Discomfort   Pain Type Surgical pain   Pain Onset 1 to 4 weeks ago   Pain Frequency Constant   Aggravating Factors  walking   Pain Relieving Factors heat and ice, pain medication                         [facility] Adult PT Treatment/Exercise - 05/31/15 1345    Knee/Hip Exercises: Aerobic   Stationary Bike L1 x 6 min   rocking back and forth    Knee/Hip Exercises: Seated   Long Arc Quad Strengthening;2 sets;10 reps;Weights   Long Arc Quad Weight 2 lbs.   Heel Slides AROM;2 sets;20 reps   Marching Strengthening;2  sets;10 reps;Weights   Marching Limitations 2   Knee/Hip Exercises: Supine   Quad Sets AROM;Strengthening;Right;1 set;10 reps   Short Arc Quad Sets Strengthening;2 sets;10 reps  2#   Heel Slides Strengthening;1 set;10 reps  with strap   Straight Leg Raises AROM;Right;2 sets;10 reps   Vasopneumatic   Number Minutes Vasopneumatic  15 minutes   Vasopnuematic Location  Knee   Vasopneumatic Pressure Medium   Vasopneumatic Temperature  32   Manual Therapy   Joint Mobilization grade 1-2 P>A tibiofemoral joint mobs                PT Education - 05/31/15 1414    Education provided Yes   Education Details updated HEP for aquatic exercises for when she goes to the pool   Person(s) Educated Patient   Methods Explanation   Comprehension Verbalized understanding          PT Short Term Goals - 05/29/15 1514    PT SHORT TERM GOAL #1   Title The patient will be demonstrated knowledge of edema control methods for home 06/14/15   Time 3   Period  Weeks   Status On-going   PT SHORT TERM GOAL #2   Title The patient will have knee flexion AROM to 115 degrees needed for greater ease with sit to stand   Time 3   Period Weeks   Status On-going   PT SHORT TERM GOAL #3   Title Knee extension against gravity to 10 degrees (sitting).     Time 3   Period Weeks   Status On-going           PT Long Term Goals - 05/24/15 1238    PT LONG TERM GOAL #1   Title The patient will be able to return to previous ex program in the pool including knee specific ROM and strengthening exercises.  07/05/15   Time 6   Period Weeks   Status New   PT LONG TERM GOAL #2   Title The patient will have knee AROM 3-118 degrees needed for gardening and household chores  07/05/15   Time 6   Period Weeks   Status New   PT LONG TERM GOAL #3   Title Quad and HS strength 3+/5 to 4-/5 needed for greater ease with sit to stand and getting in/out of the car   07/05/15   Time 6   Period Weeks   Status New   PT LONG TERM GOAL #4   Title FOTO functional outcome score improved to 39% indicating improved  function with less pain  07/05/15   Time 6   Period Weeks   Status New   PT LONG TERM GOAL #5   Title Patient will be able to walk > 300 feet with cane for grocery shopping   Time 6   Period Weeks   Status New               Plan - 05/31/15 1415    Clinical Impression Statement Denishia states she feels like she is doing a little better today with decreased swelling in the knee. She reports that the KT tape helped. Focused todays session on strengthening. modified bridges due to pain in the knee to bolster beneath the knee to decreased pain and she was able to complete the exercise. Educated on aquatic exercises so when she gets to the pool she will have exercises. Edcuated she needs to wait until the incisin has scarred over before attemtping pool exercises.  PT Next Visit Plan monitor for increased pain due to fibromyaliga, bike or Nustep; standing  weight shifting; quad activation;  manual ROM and STM, vasocompression   PT Home Exercise Plan aquatic exercises   Consulted and Agree with Plan of Care Patient        Problem List Patient Active Problem List   Diagnosis Date Noted  . Osteopenia 05/10/2015  . Depression 05/10/2015  . Primary osteoarthritis of right knee 05/08/2015  . Primary osteoarthritis of knee 05/08/2015  . Fibromyalgia    Starr Lake PT, DPT, LAT, ATC  05/31/2015  2:28 PM    [facility] [address] [place], [place], [address] Phone: [phone]   Fax:  [phone]  Name: [name] MRN: [id number] Date of Birth: 21-Jul-1950

## 2015-06-05 ENCOUNTER — Ambulatory Visit: Payer: Medicare Other | Admitting: Physical Therapy

## 2015-06-05 DIAGNOSIS — Z96651 Presence of right artificial knee joint: Secondary | ICD-10-CM

## 2015-06-05 DIAGNOSIS — M25661 Stiffness of right knee, not elsewhere classified: Secondary | ICD-10-CM

## 2015-06-05 DIAGNOSIS — R29898 Other symptoms and signs involving the musculoskeletal system: Secondary | ICD-10-CM

## 2015-06-05 DIAGNOSIS — R269 Unspecified abnormalities of gait and mobility: Secondary | ICD-10-CM

## 2015-06-06 NOTE — Therapy (Addendum)
[place], [place], [address] Phone: [phone]   Fax:  [phone]  Physical Therapy Treatment/Discharge Summary  Patient Details  Name: [name] MRN: [id number] Date of Birth: [birth year] Referring Provider: Dr. Durward Fortes  Encounter Date: 06/05/2015      PT End of Session - 06/06/15 0958    Visit Number 5   Number of Visits 12   Date for PT Re-Evaluation 07/05/15   PT Start Time 56   PT Stop Time 5009   PT Time Calculation (min) 45 min      Past Medical History  Diagnosis Date  . Arthritis   . Fibromyalgia   . Depression     takes Cymbalta daily  . PONV (postoperative nausea and vomiting)   . Family history of adverse reaction to anesthesia     mom wakes up fighting and sick  . Bruises easily   . History of bronchitis early Aug 2016  . Vertigo   . Weakness     in hands  . Joint pain   . Joint swelling   . Chronic back pain     DDD  . Osteoarthritis   . Osteopenia   . Dry skin   . GERD (gastroesophageal reflux disease)     occasionally but will take Tums if needed  . IBS (irritable bowel syndrome)   . Urinary frequency   . Urinary urgency   . Cataracts, bilateral     immature  . Glaucoma   . Anxiety   . Panic attacks   . Insomnia   . History of shingles     Past Surgical History  Procedure Laterality Date  . Foot surgery Right   . Abdominal hysterectomy  2007    with bladder sling  . Leg surgery Right   . Esophagogastroduodenoscopy    . Colonoscopy    . Total knee arthroplasty Right 05/08/2015  . Total knee arthroplasty Right 05/08/2015    Procedure: TOTAL KNEE ARTHROPLASTY;  Surgeon: Garald Balding, MD;  Location: [place];  Service: Orthopedics;  Laterality: Right;    There were no vitals filed for this visit.  Visit Diagnosis:  Status post total right knee replacement  Knee stiff, right  Weakness of right lower extremity  Abnormality of gait       Subjective Assessment - 06/05/15 1644    Subjective "Doctor said that I could drive and get in the water." "I feel like it's doing better" I went to my one month checkup yesterday they said their was no swelling or heat. I'm not taking anything but aleve and tylenol for pain. Think the CPM machine made a difference. Patient has been on her feet  all day so that has exasterbated her pain.   Pain Score 4    Pain Descriptors / Indicators Aching;Discomfort   Pain Type Surgical pain   Pain Onset 1 to 4 weeks ago   Pain Frequency Constant   Aggravating Factors  walking   Pain Relieving Factors heat and ice, pain medication            [facility] PT Assessment - 06/05/15 1658    Observation/Other Assessments   Focus on Therapeutic Outcomes (FOTO)  47% (improved from 54% limited on eval)   AROM   Right Knee Flexion 120   Strength   Right Knee Flexion 5/5   Right Knee Extension 4+/5  [facility] Adult PT Treatment/Exercise - 06/06/15 0001    Self-Care   Self-Care Other Self-Care Comments   Other Self-Care Comments  Reviewed aquatic and land HEP, checked strength and ROM and discussed improvement, took FOTO score and discussed discharge   Knee/Hip Exercises: Aerobic   Stationary Bike --  Nustep   Nustep L5 8 minutes   Knee/Hip Exercises: Seated   Heel Slides AROM;2 sets;20 reps  with washcloth                  PT Short Term Goals - 06/05/15 1654    PT SHORT TERM GOAL #1   Title The patient will be demonstrated knowledge of edema control methods for home 06/14/15   Time 3   Period Weeks   Status Achieved   PT SHORT TERM GOAL #3   Title Knee extension against gravity to 10 degrees (sitting).     Time 3   Period Weeks   Status Achieved           PT Long Term Goals - 06/05/15 1656    PT LONG TERM GOAL #1   Title The patient will be able to return to previous ex program in the pool including knee specific ROM and strengthening exercises.   07/05/15   Time 6   Status Achieved   PT LONG TERM GOAL #3   Title Quad and HS strength 3+/5 to 4-/5 needed for greater ease with sit to stand and getting in/out of the car   07/05/15   Time 6   Period Weeks   Status Achieved   PT LONG TERM GOAL #5   Title Patient will be able to walk > 300 feet with cane for grocery shopping   Time 6   Period Weeks   Status Achieved               Plan - 06/05/15 1653    Clinical Impression Statement Patients doctor cleared her to drive and get in the water and she feels like she is doing much better. Patient wishes to be discharged and will continue therapy in the pool.     G code:  Mobility walking and moving around Goal CJ Discharge status CJ  PHYSICAL THERAPY DISCHARGE SUMMARY  Visits from Start of Care: 5  Current functional level related to goals / functional outcomes: Good progress with ROM, ambulation and edema reduction.  All goals met.   See above.   Remaining deficits: Patient to continue with aquatic ex program upon discharge.     Education / Equipment: HEP, edema management Plan: Patient agrees to discharge.  Patient goals were met. Patient is being discharged due to meeting the stated rehab goals.  ?????       Problem List Patient Active Problem List   Diagnosis Date Noted  . Osteopenia 05/10/2015  . Depression 05/10/2015  . Primary osteoarthritis of right knee 05/08/2015  . Primary osteoarthritis of knee 05/08/2015  . Fibromyalgia    Laury Axon, SPTA 06/06/2015 10:46 AM PHONE:[phone] FAX:[phone]  Hessie Diener, PTA 06/06/2015 11:07 AM Phone: [phone] Fax: [phone] During this treatment session, the therapist was present, participating in and directing the treatment.  [place], [place], [address] Phone: [phone]   Fax:  [phone]  Name: [name] MRN: [id number] Date of Birth:  Mar 07, 1951  Ruben Im, PT 06/13/2015 8:35 AM Phone: [phone] Fax: [phone]

## 2015-07-20 DIAGNOSIS — H2513 Age-related nuclear cataract, bilateral: Secondary | ICD-10-CM | POA: Diagnosis not present

## 2015-07-20 DIAGNOSIS — H401121 Primary open-angle glaucoma, left eye, mild stage: Secondary | ICD-10-CM | POA: Diagnosis not present

## 2015-07-20 DIAGNOSIS — H16223 Keratoconjunctivitis sicca, not specified as Sjogren's, bilateral: Secondary | ICD-10-CM | POA: Diagnosis not present

## 2015-08-02 DIAGNOSIS — H16223 Keratoconjunctivitis sicca, not specified as Sjogren's, bilateral: Secondary | ICD-10-CM | POA: Diagnosis not present

## 2015-08-02 DIAGNOSIS — H401131 Primary open-angle glaucoma, bilateral, mild stage: Secondary | ICD-10-CM | POA: Diagnosis not present

## 2015-08-02 DIAGNOSIS — H2513 Age-related nuclear cataract, bilateral: Secondary | ICD-10-CM | POA: Diagnosis not present

## 2015-08-02 DIAGNOSIS — H1013 Acute atopic conjunctivitis, bilateral: Secondary | ICD-10-CM | POA: Diagnosis not present

## 2015-08-06 DIAGNOSIS — H16223 Keratoconjunctivitis sicca, not specified as Sjogren's, bilateral: Secondary | ICD-10-CM | POA: Diagnosis not present

## 2015-08-06 DIAGNOSIS — H1013 Acute atopic conjunctivitis, bilateral: Secondary | ICD-10-CM | POA: Diagnosis not present

## 2015-08-06 DIAGNOSIS — B308 Other viral conjunctivitis: Secondary | ICD-10-CM | POA: Diagnosis not present

## 2015-08-06 DIAGNOSIS — H401131 Primary open-angle glaucoma, bilateral, mild stage: Secondary | ICD-10-CM | POA: Diagnosis not present

## 2015-08-06 DIAGNOSIS — H2513 Age-related nuclear cataract, bilateral: Secondary | ICD-10-CM | POA: Diagnosis not present

## 2015-08-13 DIAGNOSIS — H1013 Acute atopic conjunctivitis, bilateral: Secondary | ICD-10-CM | POA: Diagnosis not present

## 2015-08-13 DIAGNOSIS — B308 Other viral conjunctivitis: Secondary | ICD-10-CM | POA: Diagnosis not present

## 2015-08-13 DIAGNOSIS — H2513 Age-related nuclear cataract, bilateral: Secondary | ICD-10-CM | POA: Diagnosis not present

## 2015-08-13 DIAGNOSIS — H16223 Keratoconjunctivitis sicca, not specified as Sjogren's, bilateral: Secondary | ICD-10-CM | POA: Diagnosis not present

## 2015-08-13 DIAGNOSIS — H401131 Primary open-angle glaucoma, bilateral, mild stage: Secondary | ICD-10-CM | POA: Diagnosis not present

## 2015-08-20 DIAGNOSIS — H40053 Ocular hypertension, bilateral: Secondary | ICD-10-CM | POA: Diagnosis not present

## 2015-08-20 DIAGNOSIS — H10413 Chronic giant papillary conjunctivitis, bilateral: Secondary | ICD-10-CM | POA: Diagnosis not present

## 2015-09-17 DIAGNOSIS — J069 Acute upper respiratory infection, unspecified: Secondary | ICD-10-CM | POA: Diagnosis not present

## 2015-09-24 DIAGNOSIS — H02206 Unspecified lagophthalmos left eye, unspecified eyelid: Secondary | ICD-10-CM | POA: Diagnosis not present

## 2015-09-24 DIAGNOSIS — B349 Viral infection, unspecified: Secondary | ICD-10-CM | POA: Diagnosis not present

## 2015-09-24 DIAGNOSIS — H40013 Open angle with borderline findings, low risk, bilateral: Secondary | ICD-10-CM | POA: Diagnosis not present

## 2015-09-24 DIAGNOSIS — H02203 Unspecified lagophthalmos right eye, unspecified eyelid: Secondary | ICD-10-CM | POA: Diagnosis not present

## 2015-09-24 DIAGNOSIS — H04123 Dry eye syndrome of bilateral lacrimal glands: Secondary | ICD-10-CM | POA: Diagnosis not present

## 2015-10-05 DIAGNOSIS — M1711 Unilateral primary osteoarthritis, right knee: Secondary | ICD-10-CM | POA: Diagnosis not present

## 2015-11-09 DIAGNOSIS — Z23 Encounter for immunization: Secondary | ICD-10-CM | POA: Diagnosis not present

## 2015-11-15 DIAGNOSIS — G4709 Other insomnia: Secondary | ICD-10-CM | POA: Diagnosis not present

## 2015-11-15 DIAGNOSIS — M797 Fibromyalgia: Secondary | ICD-10-CM | POA: Diagnosis not present

## 2015-11-15 DIAGNOSIS — R5381 Other malaise: Secondary | ICD-10-CM | POA: Diagnosis not present

## 2015-11-26 DIAGNOSIS — M542 Cervicalgia: Secondary | ICD-10-CM | POA: Diagnosis not present

## 2015-11-29 DIAGNOSIS — M542 Cervicalgia: Secondary | ICD-10-CM | POA: Diagnosis not present

## 2015-12-03 DIAGNOSIS — M542 Cervicalgia: Secondary | ICD-10-CM | POA: Diagnosis not present

## 2015-12-06 DIAGNOSIS — M542 Cervicalgia: Secondary | ICD-10-CM | POA: Diagnosis not present

## 2015-12-07 DIAGNOSIS — J069 Acute upper respiratory infection, unspecified: Secondary | ICD-10-CM | POA: Diagnosis not present

## 2016-01-21 DIAGNOSIS — R609 Edema, unspecified: Secondary | ICD-10-CM | POA: Diagnosis not present

## 2016-01-21 DIAGNOSIS — M542 Cervicalgia: Secondary | ICD-10-CM | POA: Diagnosis not present

## 2016-01-21 DIAGNOSIS — M797 Fibromyalgia: Secondary | ICD-10-CM | POA: Diagnosis not present

## 2016-01-21 DIAGNOSIS — E669 Obesity, unspecified: Secondary | ICD-10-CM | POA: Diagnosis not present

## 2016-01-21 DIAGNOSIS — R69 Illness, unspecified: Secondary | ICD-10-CM | POA: Diagnosis not present

## 2016-01-28 DIAGNOSIS — H04123 Dry eye syndrome of bilateral lacrimal glands: Secondary | ICD-10-CM | POA: Diagnosis not present

## 2016-01-28 DIAGNOSIS — H2513 Age-related nuclear cataract, bilateral: Secondary | ICD-10-CM | POA: Diagnosis not present

## 2016-01-28 DIAGNOSIS — H40013 Open angle with borderline findings, low risk, bilateral: Secondary | ICD-10-CM | POA: Diagnosis not present

## 2016-04-24 ENCOUNTER — Ambulatory Visit (INDEPENDENT_AMBULATORY_CARE_PROVIDER_SITE_OTHER): Payer: Medicare HMO | Admitting: Rheumatology

## 2016-04-24 DIAGNOSIS — G4709 Other insomnia: Secondary | ICD-10-CM

## 2016-04-24 DIAGNOSIS — M542 Cervicalgia: Secondary | ICD-10-CM

## 2016-04-24 DIAGNOSIS — R5381 Other malaise: Secondary | ICD-10-CM | POA: Diagnosis not present

## 2016-04-24 DIAGNOSIS — M797 Fibromyalgia: Secondary | ICD-10-CM

## 2016-04-24 DIAGNOSIS — R600 Localized edema: Secondary | ICD-10-CM | POA: Diagnosis not present

## 2016-04-24 DIAGNOSIS — M810 Age-related osteoporosis without current pathological fracture: Secondary | ICD-10-CM | POA: Diagnosis not present

## 2016-05-05 NOTE — Progress Notes (Signed)
MRI from aug 2016 reviewed. Already addressed in past.

## 2016-05-07 ENCOUNTER — Telehealth: Payer: Self-pay | Admitting: Rheumatology

## 2016-05-07 DIAGNOSIS — M25571 Pain in right ankle and joints of right foot: Secondary | ICD-10-CM

## 2016-05-07 NOTE — Telephone Encounter (Signed)
Patient would like to be referred to a foot specialist for her foot pain.

## 2016-05-07 NOTE — Telephone Encounter (Signed)
You evaluated her in the office on 10/212/17 she told you she fell in mud, but I do not see any note of foot pain, she now is requesting referral to foot specialist, please review/ and advise

## 2016-05-10 NOTE — Telephone Encounter (Signed)
Ok to refer to foot specialist of pt's choice.  (if no preference, dr Doran Durand of [facility] is a foot specialist).  Of course, Dr Durward Fortes can also address most foot issues and may be a great initial option if pt is agreeable to that.  Dx:  foot pain ongoing after fall in mud weeks ago.

## 2016-05-12 ENCOUNTER — Other Ambulatory Visit: Payer: Self-pay | Admitting: *Deleted

## 2016-05-12 NOTE — Telephone Encounter (Signed)
I have called her, she states she has previously seen Dr Doran Durand so referral was made to his office

## 2016-06-04 ENCOUNTER — Ambulatory Visit (INDEPENDENT_AMBULATORY_CARE_PROVIDER_SITE_OTHER): Payer: Medicare HMO

## 2016-06-04 ENCOUNTER — Ambulatory Visit (INDEPENDENT_AMBULATORY_CARE_PROVIDER_SITE_OTHER): Payer: Medicare HMO | Admitting: Rheumatology

## 2016-06-04 ENCOUNTER — Encounter: Payer: Self-pay | Admitting: Rheumatology

## 2016-06-04 VITALS — BP 129/76 | HR 71 | Resp 13 | Ht 62.0 in | Wt 211.0 lb

## 2016-06-04 DIAGNOSIS — M170 Bilateral primary osteoarthritis of knee: Secondary | ICD-10-CM | POA: Diagnosis not present

## 2016-06-04 DIAGNOSIS — M25511 Pain in right shoulder: Secondary | ICD-10-CM

## 2016-06-04 DIAGNOSIS — M5030 Other cervical disc degeneration, unspecified cervical region: Secondary | ICD-10-CM

## 2016-06-04 DIAGNOSIS — R5383 Other fatigue: Secondary | ICD-10-CM | POA: Diagnosis not present

## 2016-06-04 DIAGNOSIS — M47816 Spondylosis without myelopathy or radiculopathy, lumbar region: Secondary | ICD-10-CM

## 2016-06-04 DIAGNOSIS — M19042 Primary osteoarthritis, left hand: Secondary | ICD-10-CM

## 2016-06-04 DIAGNOSIS — M19072 Primary osteoarthritis, left ankle and foot: Secondary | ICD-10-CM

## 2016-06-04 DIAGNOSIS — M19041 Primary osteoarthritis, right hand: Secondary | ICD-10-CM | POA: Insufficient documentation

## 2016-06-04 DIAGNOSIS — G8929 Other chronic pain: Secondary | ICD-10-CM

## 2016-06-04 DIAGNOSIS — M810 Age-related osteoporosis without current pathological fracture: Secondary | ICD-10-CM

## 2016-06-04 DIAGNOSIS — M797 Fibromyalgia: Secondary | ICD-10-CM | POA: Diagnosis not present

## 2016-06-04 DIAGNOSIS — M19071 Primary osteoarthritis, right ankle and foot: Secondary | ICD-10-CM | POA: Diagnosis not present

## 2016-06-04 DIAGNOSIS — M47812 Spondylosis without myelopathy or radiculopathy, cervical region: Secondary | ICD-10-CM | POA: Insufficient documentation

## 2016-06-04 NOTE — Progress Notes (Signed)
Office Visit Note  Patient: [name]             Date of Birth: 13-Oct-1950           MRN: [id number]             PCP: Donnie Coffin, MD Referring: Aurea Graff.Marlou Sa, MD Visit Date: 06/04/2016 Occupation: @GUAROCC @    Subjective:  Pain of the Right Shoulder (Right more so than the right. ); Pain of the Left Shoulder;   History of Present Illness: [name] is a 65 y.o. female with history of fibromyalgia and osteoarthritis. She states she's been having pain in her bilateral shoulders for the last 3 weeks. Right shoulder joint is more painful. All the joints are painful. Her hands to stay swollen. She also had surgery on her right foot by Dr. Doran Durand several years ago. She continues to have pain and discomfort in the foot. She reports pain in her neck with limited range of motion. She wants to get off all the medications. She stopped Cymbalta. She describes her pain on scale of 0-10 about 9-10. She states she does not want to go to a pain clinic. She was tearful throughout the conversation due to pain.  Activities of Daily Living:  Patient reports morning stiffness for 2 hours.   Patient Reports nocturnal pain.  Difficulty dressing/grooming: Reports Difficulty climbing stairs: Reports Difficulty getting out of chair: Reports Difficulty using hands for taps, buttons, cutlery, and/or writing: Denies   Review of Systems  Constitutional: Positive for fatigue and weakness. Negative for night sweats, weight gain and weight loss.  HENT: Positive for mouth dryness. Negative for mouth sores, trouble swallowing, trouble swallowing and nose dryness.   Eyes: Positive for dryness. Negative for pain, redness and visual disturbance.  Respiratory: Negative for cough, shortness of breath and difficulty breathing.   Cardiovascular: Negative for chest pain, palpitations, hypertension, irregular heartbeat and swelling in legs/feet.  Gastrointestinal: Negative for blood in stool, constipation  and diarrhea.  Endocrine: Negative for increased urination.  Genitourinary: Negative for vaginal dryness.  Musculoskeletal: Positive for arthralgias, joint pain, myalgias, morning stiffness and myalgias. Negative for joint swelling, muscle weakness and muscle tenderness.  Skin: Negative for color change, rash, hair loss, skin tightness, ulcers and sensitivity to sunlight.  Allergic/Immunologic: Negative for susceptible to infections.  Neurological: Negative for dizziness, memory loss and night sweats.  Hematological: Negative for swollen glands.  Psychiatric/Behavioral: Positive for depressed mood and sleep disturbance. The patient is not nervous/anxious.     PMFS History:  Patient Active Problem List   Diagnosis Date Noted  . Fatigue 06/04/2016  . Primary osteoarthritis of both hands 06/04/2016  . Age-related osteoporosis without current pathological fracture 06/04/2016  . DJD (degenerative joint disease), cervical 06/04/2016  . Osteoarthritis of lumbar spine 06/04/2016  . Osteopenia 05/10/2015  . Depression 05/10/2015  . Primary osteoarthritis of right knee 05/08/2015  . Primary osteoarthritis of knee 05/08/2015  . Fibromyalgia     Past Medical History:  Diagnosis Date  . Anxiety   . Arthritis   . Bruises easily   . Cataracts, bilateral    immature  . Chronic back pain    DDD  . Depression    takes Cymbalta daily  . Dry skin   . Family history of adverse reaction to anesthesia    mom wakes up fighting and sick  . Fibromyalgia   . GERD (gastroesophageal reflux disease)    occasionally but will take Tums if needed  .  Glaucoma   . History of bronchitis early Aug 2016  . History of shingles   . IBS (irritable bowel syndrome)   . Insomnia   . Joint pain   . Joint swelling   . Osteoarthritis   . Osteopenia   . Panic attacks   . PONV (postoperative nausea and vomiting)   . Urinary frequency   . Urinary urgency   . Vertigo   . Weakness    in hands    Family  History  Problem Relation Age of Onset  . Cancer Mother   . Osteoarthritis Mother    Past Surgical History:  Procedure Laterality Date  . ABDOMINAL HYSTERECTOMY  2007   with bladder sling  . COLONOSCOPY    . ESOPHAGOGASTRODUODENOSCOPY    . FOOT SURGERY Right   . LEG SURGERY Right   . TOTAL KNEE ARTHROPLASTY Right 05/08/2015  . TOTAL KNEE ARTHROPLASTY Right 05/08/2015   Procedure: TOTAL KNEE ARTHROPLASTY;  Surgeon: Garald Balding, MD;  Location: [facility];  Service: Orthopedics;  Laterality: Right;   Social History   Social History Narrative  . No narrative on file     Objective: Vital Signs: BP 129/76 (BP Location: Right Arm, Patient Position: Sitting, Cuff Size: Large)   Pulse 71   Resp 13   Ht 5\' 2"  (1.575 m)   Wt 211 lb (95.7 kg)   BMI 38.59 kg/m    Physical Exam  Constitutional: She is oriented to person, place, and time. She appears well-developed and well-nourished.  HENT:  Head: Normocephalic and atraumatic.  Eyes: Conjunctivae and EOM are normal.  Neck: Normal range of motion.  Cardiovascular: Normal rate, regular rhythm, normal heart sounds and intact distal pulses.   Pulmonary/Chest: Effort normal and breath sounds normal.  Abdominal: Soft. Bowel sounds are normal.  Lymphadenopathy:    She has no cervical adenopathy.  Neurological: She is alert and oriented to person, place, and time.  Skin: Skin is warm and dry. Capillary refill takes less than 2 seconds.  Psychiatric: She has a normal mood and affect. Her behavior is normal.  Nursing note and vitals reviewed.    Musculoskeletal Exam: C-spine limited range of motion painful with a spasm in the trapezius area. Lumbar spine limited range of motion painful. Bilateral shoulder joints full range of motion with discomfort. Elbow joints wrist joints with good range of motion. She has severe osteoarthritic changes in her hands but. DIP PIP thickening and CMC thickening. Hip joints are good range of motion. Right  knee joint had some popping sensation which is replaced. Ankle joints are good range of motion. She has tenderness across MTP joints.  CDAI Exam: No CDAI exam completed.    Investigation: No additional findings.   Imaging: No results found.  Speciality Comments: No specialty comments available.    Procedures:  No procedures performed Allergies: Ace inhibitors; Aripiprazole; Cefixime; Codeine; Effexor [venlafaxine hydrochloride]; Escitalopram oxalate; Geodon [ziprasidone hydrochloride]; Lamictal [lamotrigine]; Lithium; Nortriptyline; Prozac [fluoxetine hcl]; Sertraline hcl; Erythromycin; Latex; and Penicillins   Assessment / Plan:     Visit Diagnoses: Fibromyalgia: She has severe pain and discomfort and myalgias with positive tender points  Fatigue, unspecified type: She continues to have remarkable fatigue.  Primary osteoarthritis of both knees - Right total knee replacement Dr. Durward Fortes which is a still symptomatic. She also have discomfort in her left knee.  Primary osteoarthritis of both hands - Severe and continues to have discomfort. Joint protection and muscle strengthening was discussed.  Osteoporosis : She  is on Fosamax.  DDD cervical spine -increase neck pain with decreased  range of motion. Plan: XR Cervical Spine 2 or 3 views. I'll refer her to Dr. Louanne Skye for evaluation. C-spine x-ray showed severe disc disease.  DDD lumbar spine chronic discomfort.  Chronic right shoulder pain -increased pain in bilateral shoulders more so in the right shoulder than the left. She had good range of motion. Plan: XR Shoulder Right which showed mild osteoarthritic changes. She may discuss her shoulder issues with Dr. Louanne Skye as well  Depression she was some quite tearful during the conversation today she is a stopped her Cymbalta herself. She states she's trying to cut back on the medications. She declined referral to pain clinic.  She complains of pain in her bilateral feet she has  history of some osteoarthritis and had foot surgery by Dr. Doran Durand in the past for her request we will refer her again to Dr. Doran Durand.   Orders: Orders Placed This Encounter  Procedures  . XR Cervical Spine 2 or 3 views  . XR Shoulder Right   No orders of the defined types were placed in this encounter.   Face-to-face time spent with patient was 45 minutes. 50% of time was spent in counseling and coordination of care.  Follow-Up Instructions: Return in about 6 months (around 12/02/2016) for Fibromyalgia.   Bo Merino, MD

## 2016-06-20 ENCOUNTER — Ambulatory Visit (INDEPENDENT_AMBULATORY_CARE_PROVIDER_SITE_OTHER): Payer: Self-pay

## 2016-06-20 ENCOUNTER — Ambulatory Visit (INDEPENDENT_AMBULATORY_CARE_PROVIDER_SITE_OTHER): Payer: Medicare HMO | Admitting: Orthopedic Surgery

## 2016-06-20 VITALS — Ht 62.0 in | Wt 211.0 lb

## 2016-06-20 DIAGNOSIS — M2042 Other hammer toe(s) (acquired), left foot: Secondary | ICD-10-CM | POA: Diagnosis not present

## 2016-06-20 DIAGNOSIS — M2041 Other hammer toe(s) (acquired), right foot: Secondary | ICD-10-CM | POA: Diagnosis not present

## 2016-06-20 DIAGNOSIS — M2021 Hallux rigidus, right foot: Secondary | ICD-10-CM | POA: Diagnosis not present

## 2016-06-20 DIAGNOSIS — M76822 Posterior tibial tendinitis, left leg: Secondary | ICD-10-CM

## 2016-06-20 DIAGNOSIS — M79671 Pain in right foot: Secondary | ICD-10-CM | POA: Diagnosis not present

## 2016-06-20 DIAGNOSIS — M79672 Pain in left foot: Secondary | ICD-10-CM | POA: Diagnosis not present

## 2016-06-20 NOTE — Progress Notes (Signed)
Office Visit Note   Patient: [name]           Date of Birth: 1951/03/28           MRN: [id number] Visit Date: 06/20/2016              Requested by: L.Donnie Coffin, MD [place] [address] [place] [place], [place] [address] PCP: Donnie Coffin, MD   Assessment & Plan: Visit Diagnoses:  1. Hallux rigidus, right foot   2. Pain in left foot   3. Posterior tibial tendinitis, left leg   4. Hammer toes of both feet   5. Pain in right foot     Plan: Discussed with the patient a complex nature of her foot deformities. Discussed that on the left foot we could proceed with a subtalar and talonavicular fusion due to the advanced collapse of the posterior tibial tendon insufficiency she would also require fusion of the great toe MTP joint as well as Weil osteotomy for the second and third metatarsals. In the meantime we will place her in a posterior tibial tendon brace to see if this would give her sufficient relief to improve her symptoms. Patient states she cannot consider surgery at this time. On the right foot she is status post hindfoot reconstruction and would recommend fusion of the great toe MTP joint with the advanced collapse of the overlapping of the great toe and second toe she would also require a Weil osteotomy for the second and third metatarsals to correct the claw toe deformity of the second and third toes. Risks and benefits of surgery were discussed discussed that she would be off her foot for at least a month postoperatively. We will see how she does with the posterior tibial tendon brace and follow-up as needed.  Follow-Up Instructions: Return if symptoms worsen or fail to improve.   Orders:  Orders Placed This Encounter  Procedures  . XR Foot 2 Views Left  . XR Foot 2 Views Right   No orders of the defined types were placed in this encounter.     Procedures: No procedures performed   Clinical Data: No additional findings.   Subjective: Chief Complaint    Patient presents with  . Left Foot - Pain  . Right Foot - Pain    Patient is in the office today for bilateral foot arthritis. A patient of Dr. Estanislado Pandy. She did have surgery on the right foot with Dr. Doran Durand in 2010 to "try to make an arch"  Pt complaints of bilateral foot pain since     Review of Systems   Objective: Vital Signs: Ht 5\' 2"  (1.575 m)   Wt 211 lb (95.7 kg)   BMI 38.59 kg/m   Physical Exam examination patient is alert oriented no adenopathy well-dressed normal affect normal rest with effort she does have an antalgic gait she has a good dorsalis pedis and posterior tibial pulse bilaterally. Examination of right foot she has advanced collapse with essentially no motion of the MTP joint with overlapping the great toe and second toe she has fixed clawing of the second and third toes. Patient has had reconstruction of the posterior tibial tendon and has good alignment of the hindfoot. Examination the left foot she has advanced collapse of the great toe MTP joint with essentially no range of motion. She has a long second and third metatarsal with flexible clawing of the second third toes. She has advanced collapse of her chronic posterior tibial tendon insufficiency  and left she has pronation and valgus she has pain to palpation along the insertion of the posterior tibial tendon she has pain to palpation in the sinus Tarsi with advanced collapse of the valgus deformity. Patient cannot do a single limb heel raise.  Ortho Exam  Specialty Comments:  No specialty comments available.  Imaging: Xr Foot 2 Views Left  Result Date: 06/20/2016 2 view radiographs of the left foot also shows a long second and third metatarsal with advanced collapse of the MTP joint of the great toe. Patient also has subtalar arthrosis with pronation and valgus through the midfoot.  Xr Foot 2 Views Right  Result Date: 06/20/2016 2 view radiographs of the right foot shows severe hallux rigidus of the  great toe MTP joint with overlapping of the great toe and second toe with a long second and third metatarsal. Patient is status post calcaneal shift and posterior tibial tendon reconstruction.    PMFS History: Patient Active Problem List   Diagnosis Date Noted  . Posterior tibial tendinitis, left leg 06/20/2016  . Hallux rigidus, right foot 06/20/2016  . Hammer toes of both feet 06/20/2016  . Fatigue 06/04/2016  . Primary osteoarthritis of both hands 06/04/2016  . Age-related osteoporosis without current pathological fracture 06/04/2016  . DJD (degenerative joint disease), cervical 06/04/2016  . Osteoarthritis of lumbar spine 06/04/2016  . Primary osteoarthritis of both feet 06/04/2016  . Osteopenia 05/10/2015  . Depression 05/10/2015  . Primary osteoarthritis of right knee 05/08/2015  . Primary osteoarthritis of knee 05/08/2015  . Fibromyalgia    Past Medical History:  Diagnosis Date  . Anxiety   . Arthritis   . Bruises easily   . Cataracts, bilateral    immature  . Chronic back pain    DDD  . Depression    takes Cymbalta daily  . Dry skin   . Family history of adverse reaction to anesthesia    mom wakes up fighting and sick  . Fibromyalgia   . GERD (gastroesophageal reflux disease)    occasionally but will take Tums if needed  . Glaucoma   . History of bronchitis early Aug 2016  . History of shingles   . IBS (irritable bowel syndrome)   . Insomnia   . Joint pain   . Joint swelling   . Osteoarthritis   . Osteopenia   . Panic attacks   . PONV (postoperative nausea and vomiting)   . Urinary frequency   . Urinary urgency   . Vertigo   . Weakness    in hands    Family History  Problem Relation Age of Onset  . Cancer Mother   . Osteoarthritis Mother     Past Surgical History:  Procedure Laterality Date  . ABDOMINAL HYSTERECTOMY  2007   with bladder sling  . COLONOSCOPY    . ESOPHAGOGASTRODUODENOSCOPY    . FOOT SURGERY Right   . LEG SURGERY Right   .  TOTAL KNEE ARTHROPLASTY Right 05/08/2015  . TOTAL KNEE ARTHROPLASTY Right 05/08/2015   Procedure: TOTAL KNEE ARTHROPLASTY;  Surgeon: Garald Balding, MD;  Location: [facility];  Service: Orthopedics;  Laterality: Right;   Social History   Occupational History  . Not on file.   Social History Main Topics  . Smoking status: Never Smoker  . Smokeless tobacco: Never Used  . Alcohol use No  . Drug use: No  . Sexual activity: Yes    Birth control/ protection: Surgical

## 2016-06-23 DIAGNOSIS — R69 Illness, unspecified: Secondary | ICD-10-CM | POA: Diagnosis not present

## 2016-06-23 DIAGNOSIS — R0781 Pleurodynia: Secondary | ICD-10-CM | POA: Diagnosis not present

## 2016-06-23 DIAGNOSIS — M797 Fibromyalgia: Secondary | ICD-10-CM | POA: Diagnosis not present

## 2016-06-23 DIAGNOSIS — Z23 Encounter for immunization: Secondary | ICD-10-CM | POA: Diagnosis not present

## 2016-07-09 DIAGNOSIS — Z0000 Encounter for general adult medical examination without abnormal findings: Secondary | ICD-10-CM | POA: Diagnosis not present

## 2016-07-09 DIAGNOSIS — M818 Other osteoporosis without current pathological fracture: Secondary | ICD-10-CM | POA: Diagnosis not present

## 2016-07-09 DIAGNOSIS — Z6838 Body mass index (BMI) 38.0-38.9, adult: Secondary | ICD-10-CM | POA: Diagnosis not present

## 2016-08-03 ENCOUNTER — Other Ambulatory Visit: Payer: Self-pay | Admitting: Rheumatology

## 2016-08-05 NOTE — Telephone Encounter (Signed)
Last Visit: 06/04/16 Next Visit: 12/10/16 Labs: 04/2015 Calcium 8.4 Hgb: 10.4  Okay to refill Fosamax?

## 2016-08-05 NOTE — Telephone Encounter (Signed)
Ok

## 2016-09-09 ENCOUNTER — Telehealth: Payer: Self-pay | Admitting: Rheumatology

## 2016-09-09 MED ORDER — GABAPENTIN 300 MG PO CAPS: 600.0000 mg | ORAL_CAPSULE | Freq: Three times a day (TID) | ORAL | 1 refills | Status: DC

## 2016-09-09 NOTE — Telephone Encounter (Signed)
Last Visit: 06/04/16 Next visit: 12/10/16  Okay to refill Gabapentin?

## 2016-09-09 NOTE — Telephone Encounter (Signed)
Prescription sent to pharmacy.

## 2016-09-09 NOTE — Telephone Encounter (Signed)
Patient called requesting a refill on her gabapentin.  She is needing [facility] refill [facility] [place].  [phone].  Thank you.

## 2016-09-09 NOTE — Telephone Encounter (Signed)
ok 

## 2016-09-10 ENCOUNTER — Other Ambulatory Visit: Payer: Self-pay | Admitting: *Deleted

## 2016-09-10 ENCOUNTER — Telehealth (INDEPENDENT_AMBULATORY_CARE_PROVIDER_SITE_OTHER): Payer: Self-pay | Admitting: Rheumatology

## 2016-09-10 MED ORDER — GABAPENTIN 300 MG PO CAPS: 600.0000 mg | ORAL_CAPSULE | Freq: Three times a day (TID) | ORAL | 1 refills | Status: DC

## 2016-09-10 NOTE — Telephone Encounter (Signed)
[facility] [address] [facility]  Please call patient back    [phone].

## 2016-09-10 NOTE — Telephone Encounter (Signed)
[facility] 

## 2016-10-02 ENCOUNTER — Encounter (INDEPENDENT_AMBULATORY_CARE_PROVIDER_SITE_OTHER): Payer: Self-pay | Admitting: Orthopaedic Surgery

## 2016-10-02 ENCOUNTER — Ambulatory Visit: Payer: Medicare HMO | Admitting: Rheumatology

## 2016-10-02 ENCOUNTER — Ambulatory Visit (INDEPENDENT_AMBULATORY_CARE_PROVIDER_SITE_OTHER): Payer: Medicare HMO | Admitting: Orthopaedic Surgery

## 2016-10-02 VITALS — Resp 14 | Ht 62.0 in | Wt 200.0 lb

## 2016-10-02 DIAGNOSIS — Z96651 Presence of right artificial knee joint: Secondary | ICD-10-CM | POA: Diagnosis not present

## 2016-10-02 NOTE — Progress Notes (Signed)
Office Visit Note   Patient: [name]           Date of Birth: 12/24/1950           MRN: [id number] Visit Date: 10/02/2016              Requested by: L.Donnie Coffin, MD [facility] [address] [place], [place] [address] PCP: Donnie Coffin, MD   Assessment & Plan: Visit Diagnoses:  1. S/P total knee replacement using cement, right   With excellent results  Plan:  #1: Continue her usual exercises #2: Return to her exercises that she did after surgery including that of straight leg raising and increase weights as tolerated  Follow-Up Instructions: Return in about 1 year (around 10/02/2017).   Orders:  No orders of the defined types were placed in this encounter.  No orders of the defined types were placed in this encounter.     Procedures: No procedures performed   Clinical Data: No additional findings.   Subjective: Chief Complaint  Patient presents with  . Right Knee - Routine Post Op, Pain    [name] is one year and months status post right knee replacement.  She relates she still  has numbness and tingling in the right knee along the lateral aspect of her knee starting above her surgical incision and down to midcalf. She also states she has numbness in both feet and both hands. She has been diagnosed with fibromyalgia  and is being cared for by Dr. Estanislado Pandy. There is a firm "knot" on the patella that was not there before surgery but does not hurt. She does chair yoga, tai chi and swimming for exercises. The only thing that gives her relief is Biofreeze. Otherwise she really is not limited with pain or discomfort.    Review of Systems  Constitutional: Negative.   HENT: Negative.   Respiratory: Negative.   Cardiovascular: Negative.   Gastrointestinal: Negative.   Genitourinary: Negative.   Skin: Negative.   Neurological: Negative.   Hematological: Negative.   Psychiatric/Behavioral: Negative.      Objective: Vital Signs: Resp 14   Ht 5'  2" (1.575 m)   Wt 200 lb (90.7 kg)   BMI 36.58 kg/m   Physical Exam  Constitutional: She is oriented to person, place, and time. She appears well-developed and well-nourished.  HENT:  Head: Normocephalic and atraumatic.  Eyes: EOM are normal. Pupils are equal, round, and reactive to light.  Pulmonary/Chest: Effort normal.  Neurological: She is alert and oriented to person, place, and time.  Skin: Skin is warm and dry.  Psychiatric: She has a normal mood and affect. Her behavior is normal. Judgment and thought content normal.    Right Knee Exam   Tenderness  The patient is experiencing no tenderness.     Range of Motion  Extension: 0  Flexion: 120   Tests  Drawer:       Anterior - 1+    Posterior - negative Varus: negative Valgus: negative Patellar Apprehension: negative  Comments:  She does have decreased sensation to light touch over the lateral aspect of her knee beginning about 3 cm above the incision and down below the incision by about 3-4 cm. There is a palpable knot on the medial aspect of the patella about the 4 mm from the edge of the patella. He is not particularly painful.      Specialty Comments:  No specialty comments available.  Imaging: No results found.  PMFS History: Patient Active Problem List   Diagnosis Date Noted  . Posterior tibial tendinitis, left leg 06/20/2016  . Hallux rigidus, right foot 06/20/2016  . Hammer toes of both feet 06/20/2016  . Fatigue 06/04/2016  . Primary osteoarthritis of both hands 06/04/2016  . Age-related osteoporosis without current pathological fracture 06/04/2016  . DJD (degenerative joint disease), cervical 06/04/2016  . Osteoarthritis of lumbar spine 06/04/2016  . Primary osteoarthritis of both feet 06/04/2016  . Osteopenia 05/10/2015  . Depression 05/10/2015  . Primary osteoarthritis of right knee 05/08/2015  . Primary osteoarthritis of knee 05/08/2015  . Fibromyalgia    Past Medical History:    Diagnosis Date  . Anxiety   . Arthritis   . Bruises easily   . Cataracts, bilateral    immature  . Chronic back pain    DDD  . Depression    takes Cymbalta daily  . Dry skin   . Family history of adverse reaction to anesthesia    mom wakes up fighting and sick  . Fibromyalgia   . GERD (gastroesophageal reflux disease)    occasionally but will take Tums if needed  . Glaucoma   . History of bronchitis early Aug 2016  . History of shingles   . IBS (irritable bowel syndrome)   . Insomnia   . Joint pain   . Joint swelling   . Osteoarthritis   . Osteopenia   . Panic attacks   . PONV (postoperative nausea and vomiting)   . Urinary frequency   . Urinary urgency   . Vertigo   . Weakness    in hands    Family History  Problem Relation Age of Onset  . Cancer Mother   . Osteoarthritis Mother     Past Surgical History:  Procedure Laterality Date  . ABDOMINAL HYSTERECTOMY  2007   with bladder sling  . COLONOSCOPY    . ESOPHAGOGASTRODUODENOSCOPY    . FOOT SURGERY Right   . LEG SURGERY Right   . TOTAL KNEE ARTHROPLASTY Right 05/08/2015  . TOTAL KNEE ARTHROPLASTY Right 05/08/2015   Procedure: TOTAL KNEE ARTHROPLASTY;  Surgeon: Garald Balding, MD;  Location: [facility];  Service: Orthopedics;  Laterality: Right;   Social History   Occupational History  . Not on file.   Social History Main Topics  . Smoking status: Never Smoker  . Smokeless tobacco: Never Used  . Alcohol use No  . Drug use: No  . Sexual activity: Yes    Birth control/ protection: Surgical

## 2016-10-06 ENCOUNTER — Ambulatory Visit (INDEPENDENT_AMBULATORY_CARE_PROVIDER_SITE_OTHER): Payer: Self-pay | Admitting: Orthopaedic Surgery

## 2016-10-20 DIAGNOSIS — H40013 Open angle with borderline findings, low risk, bilateral: Secondary | ICD-10-CM | POA: Diagnosis not present

## 2016-10-20 DIAGNOSIS — H2513 Age-related nuclear cataract, bilateral: Secondary | ICD-10-CM | POA: Diagnosis not present

## 2016-10-20 DIAGNOSIS — H04123 Dry eye syndrome of bilateral lacrimal glands: Secondary | ICD-10-CM | POA: Diagnosis not present

## 2016-11-03 ENCOUNTER — Telehealth (INDEPENDENT_AMBULATORY_CARE_PROVIDER_SITE_OTHER): Payer: Self-pay | Admitting: Rheumatology

## 2016-11-03 ENCOUNTER — Other Ambulatory Visit: Payer: Self-pay | Admitting: Radiology

## 2016-11-03 ENCOUNTER — Other Ambulatory Visit (INDEPENDENT_AMBULATORY_CARE_PROVIDER_SITE_OTHER): Payer: Self-pay | Admitting: Rheumatology

## 2016-11-03 DIAGNOSIS — Z79899 Other long term (current) drug therapy: Secondary | ICD-10-CM | POA: Diagnosis not present

## 2016-11-03 LAB — COMPLETE METABOLIC PANEL WITH GFR
ALT: 16 U/L (ref 6–29)
AST: 22 U/L (ref 10–35)
Albumin: 3.9 g/dL (ref 3.6–5.1)
Alkaline Phosphatase: 65 U/L (ref 33–130)
BUN: 18 mg/dL (ref 7–25)
CALCIUM: 9.6 mg/dL (ref 8.6–10.4)
CHLORIDE: 104 mmol/L (ref 98–110)
CO2: 28 mmol/L (ref 20–31)
Creat: 0.78 mg/dL (ref 0.50–0.99)
GFR, Est African American: >89 mL/min (ref >=60)
GFR, Est Non African American: 81 mL/min (ref >=60)
Glucose, Bld: 86 mg/dL (ref 65–99)
POTASSIUM: 4.7 mmol/L (ref 3.5–5.3)
Sodium: 140 mmol/L (ref 135–146)
Total Bilirubin: 0.3 mg/dL (ref 0.2–1.2)
Total Protein: 7.2 g/dL (ref 6.1–8.1)

## 2016-11-03 LAB — CBC WITH DIFFERENTIAL/PLATELET
BASOS ABS: 66 {cells}/uL (ref 0–200)
Basophils Relative: 1 %
EOS ABS: 132 {cells}/uL (ref 15–500)
Eosinophils Relative: 2 %
HEMATOCRIT: 42 % (ref 35.0–45.0)
HEMOGLOBIN: 13.6 g/dL (ref 11.7–15.5)
LYMPHS ABS: 2904 {cells}/uL (ref 850–3900)
Lymphocytes Relative: 44 %
MCH: 30.8 pg (ref 27.0–33.0)
MCHC: 32.4 g/dL (ref 32.0–36.0)
MCV: 95.2 fL (ref 80.0–100.0)
MPV: 9.2 fL (ref 7.5–12.5)
Monocytes Absolute: 594 cells/uL (ref 200–950)
Monocytes Relative: 9 %
NEUTROS PCT: 44 %
Neutro Abs: 2904 cells/uL (ref 1500–7800)
Platelets: 306 10*3/uL (ref 140–400)
RBC: 4.41 MIL/uL (ref 3.80–5.10)
RDW: 13.7 % (ref 11.0–15.0)
WBC: 6.6 10*3/uL (ref 3.8–10.8)

## 2016-11-03 MED ORDER — ALENDRONATE SODIUM 70 MG PO TABS: 70.0000 mg | ORAL_TABLET | ORAL | 0 refills | Status: DC

## 2016-11-03 NOTE — Telephone Encounter (Signed)
Thanks for noting past due labs. Ok to refill fosamax . Pt scheduled to be seen may 2018 and we can draw it at that visit.

## 2016-11-03 NOTE — Telephone Encounter (Signed)
No labs since 2016 patient states she can come by later this week, but she is selling her house so is not sure when it will be, I have put in orders ok for one month supply of Fosamax?

## 2016-11-03 NOTE — Telephone Encounter (Signed)
Patient request RF On Alendronate 70 mg to be called in. [facility]ses [facility] [address]. Please do not use [facility] [address].

## 2016-11-03 NOTE — Telephone Encounter (Signed)
06/04/16 last visit  12/10/16 next visit   Labs not since 2016 called patient to advise these are due.  When can she come in? Unable to leave message recording states wireless customer unavailable.

## 2016-11-04 ENCOUNTER — Telehealth: Payer: Self-pay | Admitting: Radiology

## 2016-11-04 NOTE — Telephone Encounter (Signed)
No answer-- unable to leave message.

## 2016-11-04 NOTE — Progress Notes (Signed)
WNL

## 2016-11-04 NOTE — Telephone Encounter (Signed)
-----   Message from Bo Merino, MD sent at 11/04/2016 12:23 PM EDT ----- WNL

## 2016-11-04 NOTE — Telephone Encounter (Signed)
I have called patient to advise labs are normal  

## 2016-11-04 NOTE — Telephone Encounter (Signed)
Patient returning call.

## 2016-11-05 ENCOUNTER — Other Ambulatory Visit: Payer: Self-pay | Admitting: Radiology

## 2016-11-05 MED ORDER — ALENDRONATE SODIUM 70 MG PO TABS: 70.0000 mg | ORAL_TABLET | ORAL | 0 refills | Status: DC

## 2016-11-05 NOTE — Telephone Encounter (Signed)
I have called patient to advise labs are normal  

## 2016-11-20 DIAGNOSIS — L0390 Cellulitis, unspecified: Secondary | ICD-10-CM | POA: Diagnosis not present

## 2016-11-20 DIAGNOSIS — L570 Actinic keratosis: Secondary | ICD-10-CM | POA: Diagnosis not present

## 2016-11-20 DIAGNOSIS — W57XXXA Bitten or stung by nonvenomous insect and other nonvenomous arthropods, initial encounter: Secondary | ICD-10-CM | POA: Diagnosis not present

## 2016-11-26 DIAGNOSIS — W57XXXA Bitten or stung by nonvenomous insect and other nonvenomous arthropods, initial encounter: Secondary | ICD-10-CM | POA: Diagnosis not present

## 2016-11-26 DIAGNOSIS — T7840XA Allergy, unspecified, initial encounter: Secondary | ICD-10-CM | POA: Diagnosis not present

## 2016-11-28 DIAGNOSIS — L200 Besnier's prurigo: Secondary | ICD-10-CM | POA: Diagnosis not present

## 2016-11-28 DIAGNOSIS — S40861S Insect bite (nonvenomous) of right upper arm, sequela: Secondary | ICD-10-CM | POA: Diagnosis not present

## 2016-11-28 DIAGNOSIS — W57XXXA Bitten or stung by nonvenomous insect and other nonvenomous arthropods, initial encounter: Secondary | ICD-10-CM | POA: Diagnosis not present

## 2016-11-28 NOTE — Progress Notes (Signed)
Office Visit Note  Patient: [name]             Date of Birth: 15-Mar-1951           MRN: [id number]             PCP: Alroy Dust, L.Marlou Sa, MD Referring: Alroy Dust, Carlean Jews.Marlou Sa, MD Visit Date: 12/10/2016 Occupation: '@GUAROCC' @    Subjective:  Medication Management   History of Present Illness: [name] is a 66 y.o. female  Here for FMS f-u  Had a rough winter due to [place]. Was having a lot of discomfort intermittently.  Pt has tapered off the tramadol. And uses it minimally  Using gabapentin 316m; 1 pill per day (and on occasion, 1 pill at night).  Fms pain rated at "1" today.  Using alternative healing (meditation, cayene into coffee, golden milk (turmeric,honey, dates, etc in hot milk)  Exercising ==> Tai chi, chair yoga, water aerobics.  Concerned about right foot dropping at times (pt has to consciously lift right foot at times to prevent from falling/tripping). Pt has fallen several times due to this.  Pt noted the issue prior to right knee surgery. The problem is continued even after the knee surgery despite doing physical therapy for the knee. The knee is doing relatively well. Patient feels that her weight may have a great deal to do with the foot drop however, I wonder why the left foot is not affected like the right foot is. Patient has been advised that a neurologic referral may be needed in the future but for now we can start with physical therapy at a place of patient's preference and patient is agreeable. (Patient [place] to do physical therapy until all that is settled down.)  Activities of Daily Living:  Patient reports morning stiffness for 15 minutes.   Patient Denies nocturnal pain.  Difficulty dressing/grooming: Denies Difficulty climbing stairs: Denies Difficulty getting out of chair: Denies Difficulty using hands for taps, buttons, cutlery, and/or writing:  Denies   Review of Systems  Constitutional: Positive for fatigue.  HENT: Negative for mouth sores and mouth dryness.   Eyes: Negative for dryness.  Respiratory: Negative for shortness of breath.   Gastrointestinal: Negative for constipation and diarrhea.  Musculoskeletal: Positive for myalgias and myalgias.  Skin: Negative for sensitivity to sunlight.  Psychiatric/Behavioral: Positive for sleep disturbance. Negative for decreased concentration.    PMFS History:  Patient Active Problem List   Diagnosis Date Noted  . Posterior tibial tendinitis, left leg 06/20/2016  . Hallux rigidus, right foot 06/20/2016  . Hammer toes of both feet 06/20/2016  . Fatigue 06/04/2016  . Primary osteoarthritis of both hands 06/04/2016  . Age-related osteoporosis without current pathological fracture 06/04/2016  . DJD (degenerative joint disease), cervical 06/04/2016  . Osteoarthritis of lumbar spine 06/04/2016  . Primary osteoarthritis of both feet 06/04/2016  . Osteopenia 05/10/2015  . Depression 05/10/2015  . Primary osteoarthritis of knee 05/08/2015  . Fibromyalgia     Past Medical History:  Diagnosis Date  . Anxiety   . Arthritis   . Bruises easily   . Cataracts, bilateral    immature  . Chronic back pain    DDD  . Depression    takes Cymbalta daily  . Dry skin   . Family history of adverse reaction to anesthesia    mom wakes up fighting and sick  . Fibromyalgia   .  GERD (gastroesophageal reflux disease)    occasionally but will take Tums if needed  . Glaucoma   . History of bronchitis early Aug 2016  . History of shingles   . IBS (irritable bowel syndrome)   . Insomnia   . Joint pain   . Joint swelling   . Osteoarthritis   . Osteopenia   . Panic attacks   . PONV (postoperative nausea and vomiting)   . Urinary frequency   . Urinary urgency   . Vertigo   . Weakness    in hands    Family History  Problem Relation Age of Onset  . Cancer Mother   . Osteoarthritis Mother     Past Surgical History:  Procedure Laterality Date  . ABDOMINAL HYSTERECTOMY  2007   with bladder sling  . COLONOSCOPY    . ESOPHAGOGASTRODUODENOSCOPY    . FOOT SURGERY Right   . LEG SURGERY Right   . TOTAL KNEE ARTHROPLASTY Right 05/08/2015  . TOTAL KNEE ARTHROPLASTY Right 05/08/2015   Procedure: TOTAL KNEE ARTHROPLASTY;  Surgeon: Garald Balding, MD;  Location: [facility];  Service: Orthopedics;  Laterality: Right;   Social History   Social History Narrative  . No narrative on file     Objective: Vital Signs: BP 130/64   Pulse 78   Resp 14   Ht '5\' 2"'  (1.575 m)   Wt 204 lb (92.5 kg)   BMI 37.31 kg/m    Physical Exam  Constitutional: She is oriented to person, place, and time. She appears well-developed and well-nourished.  HENT:  Head: Normocephalic and atraumatic.  Eyes: EOM are normal. Pupils are equal, round, and reactive to light.  Cardiovascular: Normal rate, regular rhythm and normal heart sounds.  Exam reveals no gallop and no friction rub.   No murmur heard. Pulmonary/Chest: Effort normal and breath sounds normal. She has no wheezes. She has no rales.  Abdominal: Soft. Bowel sounds are normal. She exhibits no distension. There is no tenderness. There is no guarding. No hernia.  Musculoskeletal: Normal range of motion. She exhibits no edema, tenderness or deformity.  Lymphadenopathy:    She has no cervical adenopathy.  Neurological: She is alert and oriented to person, place, and time. Coordination normal.  Skin: Skin is warm and dry. Capillary refill takes less than 2 seconds. No rash noted.  Psychiatric: She has a normal mood and affect. Her behavior is normal.  Nursing note and vitals reviewed.    Musculoskeletal Exam:  Full range of motion of all joints Grip strength is equal and strong bilaterally Fibromyalgia tender points are absent today  CDAI Exam: CDAI Homunculus Exam:   Joint Counts:  CDAI Tender Joint count: 0 CDAI Swollen Joint count:  0  Global Assessments:  Patient Global Assessment: 1 Provider Global Assessment: 1  CDAI Calculated Score: 2    Investigation: No additional findings. CBC Latest Ref Rng & Units 11/03/2016 05/10/2015 05/09/2015  WBC 3.8 - 10.8 K/uL 6.6 8.3 7.7  Hemoglobin 11.7 - 15.5 g/dL 13.6 10.4(L) 10.7(L)  Hematocrit 35.0 - 45.0 % 42.0 33.1(L) 33.2(L)  Platelets 140 - 400 K/uL 306 250 241   CMP Latest Ref Rng & Units 11/03/2016 05/10/2015 05/09/2015  Glucose 65 - 99 mg/dL 86 118(H) 113(H)  BUN 7 - 25 mg/dL '18 12 11  ' Creatinine 0.50 - 0.99 mg/dL 0.78 0.83 0.65  Sodium 135 - 146 mmol/L 140 134(L) 139  Potassium 3.5 - 5.3 mmol/L 4.7 4.7 4.7  Chloride 98 - 110 mmol/L 104  102 102  CO2 20 - 31 mmol/L '28 28 28  ' Calcium 8.6 - 10.4 mg/dL 9.6 8.3(L) 8.9  Total Protein 6.1 - 8.1 g/dL 7.2 - -  Total Bilirubin 0.2 - 1.2 mg/dL 0.3 - -  Alkaline Phos 33 - 130 U/L 65 - -  AST 10 - 35 U/L 22 - -  ALT 6 - 29 U/L 16 - -    Imaging: No results found.  Speciality Comments: No specialty comments available.    Procedures:  No procedures performed Allergies: Ace inhibitors; Aripiprazole; Cefixime; Codeine; Effexor [venlafaxine hydrochloride]; Escitalopram oxalate; Geodon [ziprasidone hydrochloride]; Lamictal [lamotrigine]; Lithium; Nortriptyline; Prozac [fluoxetine hcl]; Sertraline hcl; Erythromycin; Latex; and Penicillins   Assessment / Plan:     Visit Diagnoses: Fibromyalgia  Fatigue, unspecified type  DJD (degenerative joint disease), cervical  Hallux rigidus, right foot  Hammer toes of both feet  Osteoarthritis of lumbar spine, unspecified spinal osteoarthritis complication status  Posterior tibial tendinitis, left leg  Primary osteoarthritis of both feet  Primary osteoarthritis of both hands  Primary osteoarthritis of both knees  History of depression  Age-related osteoporosis without current pathological fracture - Plan: DG Bone Density  Vitamin D deficiency - Plan: VITAMIN D 25  Hydroxy (Vit-D Deficiency, Fractures)   Plan: #1: Fibromyalgia syndrome. Doing well currently but she had a very bad winter time. Currently her fibromyalgia discomfort is rated 1 on a scale of 0-10. Patient is using alternative treatment including meditation, etc. Patient has discontinued all fair amount of her medication. She uses minimal amount of tramadol. She is using lower dose of gabapentin (usually 1 per day and on rare occasions 2 per day). She has stopped using Robaxin and gets more relief with baclofen which she uses when necessary on rare occasions. She also uses Soma at night when necessary.  #2: Right foot drop. Patient states that on occasion, her right foot does not lift properly as she is ambulating and causes her to trip. She has to conscientiously lift her right foot to prevent falls. She thinks that much of this problem comes from her excessive weight. Offered patient physical therapy for right foot drop. I've written her prescription. Currently, she is in the process of moving from one house to another and will not be able to attend physical therapy. She would like to go sometime between now and the next 6 months if she has the time. Note: Her right foot drop is intermittent and she notices that when she exercises and gets more strength in her right leg, it occurs minimally.  #3: History of osteoporosis. Last bone density was done in February 2016 at [facility]. If possible, she should go back to the same place to get updated bone density done. If this is not possible machine is not available, I have written her prescription for the [facility]. Currently she is on Fosamax and she will continue that. She does report that she's been taking it for about 5 years and slightly longer. We will consider changing her to Reclast. She may qualify for this medication since she will be 66 and on Medicare in about 3 weeks. She will come back to our office to discuss her  updated bone density  #4: History of vitamin D deficiency. We will update her vitamin D now. Note that she has history of osteoporosis and its to have proper levels of vitamin D.  #5: Patient has successfully minimized her tramadol and gabapentin. She uses baclofen minimally. She uses Soma minimally. She  is a strong believer in alternative medication and follows those benefits successfully. This includes meditation, golden milk, yoga, etc.  6: Return to clinic in 6 months for follow-up on fibromyalgia She'll come back sooner to discuss her bone density results   Orders: Orders Placed This Encounter  Procedures  . DG Bone Density  . VITAMIN D 25 Hydroxy (Vit-D Deficiency, Fractures)   No orders of the defined types were placed in this encounter.   Face-to-face time spent with patient was 40 minutes. 50% of time was spent in counseling and coordination of care.  Follow-Up Instructions: Return in about 5 months (around 05/12/2017) for FMS, [place], oporosis ->fosamax -.   Bo Merino, MD   If DXA is stable we will consider holiday drug holiday from Fosamax. I examined and evaluated the patient with Eliezer Lofts PA. The plan of care was discussed as noted above.  Bo Merino, MD Note - This record has been created using Editor, commissioning.  Chart creation errors have been sought, but may not always  have been located. Such creation errors do not reflect on  the standard of medical care.

## 2016-12-03 DIAGNOSIS — H04123 Dry eye syndrome of bilateral lacrimal glands: Secondary | ICD-10-CM | POA: Diagnosis not present

## 2016-12-03 DIAGNOSIS — H40013 Open angle with borderline findings, low risk, bilateral: Secondary | ICD-10-CM | POA: Diagnosis not present

## 2016-12-03 DIAGNOSIS — H2513 Age-related nuclear cataract, bilateral: Secondary | ICD-10-CM | POA: Diagnosis not present

## 2016-12-04 DIAGNOSIS — L570 Actinic keratosis: Secondary | ICD-10-CM | POA: Diagnosis not present

## 2016-12-04 DIAGNOSIS — L309 Dermatitis, unspecified: Secondary | ICD-10-CM | POA: Diagnosis not present

## 2016-12-08 ENCOUNTER — Ambulatory Visit: Payer: Medicare HMO | Admitting: Rheumatology

## 2016-12-10 ENCOUNTER — Encounter: Payer: Self-pay | Admitting: Rheumatology

## 2016-12-10 ENCOUNTER — Ambulatory Visit (INDEPENDENT_AMBULATORY_CARE_PROVIDER_SITE_OTHER): Payer: Medicare HMO | Admitting: Rheumatology

## 2016-12-10 VITALS — BP 130/64 | HR 78 | Resp 14 | Ht 62.0 in | Wt 204.0 lb

## 2016-12-10 DIAGNOSIS — E559 Vitamin D deficiency, unspecified: Secondary | ICD-10-CM

## 2016-12-10 DIAGNOSIS — M47816 Spondylosis without myelopathy or radiculopathy, lumbar region: Secondary | ICD-10-CM | POA: Diagnosis not present

## 2016-12-10 DIAGNOSIS — M2041 Other hammer toe(s) (acquired), right foot: Secondary | ICD-10-CM

## 2016-12-10 DIAGNOSIS — M47812 Spondylosis without myelopathy or radiculopathy, cervical region: Secondary | ICD-10-CM

## 2016-12-10 DIAGNOSIS — M170 Bilateral primary osteoarthritis of knee: Secondary | ICD-10-CM | POA: Diagnosis not present

## 2016-12-10 DIAGNOSIS — M810 Age-related osteoporosis without current pathological fracture: Secondary | ICD-10-CM

## 2016-12-10 DIAGNOSIS — M797 Fibromyalgia: Secondary | ICD-10-CM

## 2016-12-10 DIAGNOSIS — R5383 Other fatigue: Secondary | ICD-10-CM

## 2016-12-10 DIAGNOSIS — Z8659 Personal history of other mental and behavioral disorders: Secondary | ICD-10-CM

## 2016-12-10 DIAGNOSIS — M19071 Primary osteoarthritis, right ankle and foot: Secondary | ICD-10-CM | POA: Diagnosis not present

## 2016-12-10 DIAGNOSIS — M19041 Primary osteoarthritis, right hand: Secondary | ICD-10-CM | POA: Diagnosis not present

## 2016-12-10 DIAGNOSIS — M19042 Primary osteoarthritis, left hand: Secondary | ICD-10-CM

## 2016-12-10 DIAGNOSIS — M5030 Other cervical disc degeneration, unspecified cervical region: Secondary | ICD-10-CM | POA: Diagnosis not present

## 2016-12-10 DIAGNOSIS — M2021 Hallux rigidus, right foot: Secondary | ICD-10-CM | POA: Diagnosis not present

## 2016-12-10 DIAGNOSIS — M19072 Primary osteoarthritis, left ankle and foot: Secondary | ICD-10-CM

## 2016-12-10 DIAGNOSIS — M76822 Posterior tibial tendinitis, left leg: Secondary | ICD-10-CM | POA: Diagnosis not present

## 2016-12-10 DIAGNOSIS — M2042 Other hammer toe(s) (acquired), left foot: Secondary | ICD-10-CM

## 2016-12-11 LAB — VITAMIN D 25 HYDROXY (VIT D DEFICIENCY, FRACTURES): Vit D, 25-Hydroxy: 32 ng/mL (ref 30–100)

## 2016-12-11 NOTE — Progress Notes (Signed)
Please tell patient her vitamin D levels are within normal limits but she may benefit from taking thousand units daily.If already doing so, she can take 5000 units on Saturday and 5000 units on Sunday.

## 2017-01-20 ENCOUNTER — Other Ambulatory Visit: Payer: Self-pay | Admitting: Rheumatology

## 2017-01-20 MED ORDER — CARISOPRODOL 350 MG PO TABS: 350.0000 mg | ORAL_TABLET | Freq: Every day | ORAL | 0 refills | Status: DC

## 2017-01-20 NOTE — Telephone Encounter (Signed)
ok 

## 2017-01-20 NOTE — Telephone Encounter (Signed)
Patient left a message requesting a refill on her Soma.  She uses [facility] on [address].  Thank you.

## 2017-01-20 NOTE — Telephone Encounter (Signed)
Last Visit: 12/10/16 Next Visit: 05/13/17  Okay to refill Soma?

## 2017-01-21 ENCOUNTER — Other Ambulatory Visit: Payer: Self-pay | Admitting: Rheumatology

## 2017-01-21 DIAGNOSIS — Z23 Encounter for immunization: Secondary | ICD-10-CM | POA: Diagnosis not present

## 2017-02-02 ENCOUNTER — Encounter: Payer: Self-pay | Admitting: Orthopaedic Surgery

## 2017-02-02 DIAGNOSIS — M8589 Other specified disorders of bone density and structure, multiple sites: Secondary | ICD-10-CM | POA: Diagnosis not present

## 2017-02-05 DIAGNOSIS — M25511 Pain in right shoulder: Secondary | ICD-10-CM | POA: Diagnosis not present

## 2017-02-20 DIAGNOSIS — M25519 Pain in unspecified shoulder: Secondary | ICD-10-CM | POA: Diagnosis not present

## 2017-02-20 DIAGNOSIS — H6980 Other specified disorders of Eustachian tube, unspecified ear: Secondary | ICD-10-CM | POA: Diagnosis not present

## 2017-02-26 ENCOUNTER — Telehealth: Payer: Self-pay | Admitting: Rheumatology

## 2017-02-26 ENCOUNTER — Other Ambulatory Visit: Payer: Self-pay | Admitting: Rheumatology

## 2017-02-26 DIAGNOSIS — H9202 Otalgia, left ear: Secondary | ICD-10-CM | POA: Diagnosis not present

## 2017-02-26 NOTE — Telephone Encounter (Signed)
Please change patient's preferred pharmacy to [facility] on [address].

## 2017-02-26 NOTE — Telephone Encounter (Signed)
12/10/16 last visit  05/13/17 next visit  CMP Latest Ref Rng & Units 11/03/2016 05/10/2015 05/09/2015  Glucose 65 - 99 mg/dL 86 118(H) 113(H)  BUN 7 - 25 mg/dL 18 12 11   Creatinine 0.50 - 0.99 mg/dL 0.78 0.83 0.65  Sodium 135 - 146 mmol/L 140 134(L) 139  Potassium 3.5 - 5.3 mmol/L 4.7 4.7 4.7  Chloride 98 - 110 mmol/L 104 102 102  CO2 20 - 31 mmol/L 28 28 28   Calcium 8.6 - 10.4 mg/dL 9.6 8.3(L) 8.9  Total Protein 6.1 - 8.1 g/dL 7.2 - -  Total Bilirubin 0.2 - 1.2 mg/dL 0.3 - -  Alkaline Phos 33 - 130 U/L 65 - -  AST 10 - 35 U/L 22 - -  ALT 6 - 29 U/L 16 - -   Ok to refill per Dr Estanislado Pandy

## 2017-02-27 NOTE — Telephone Encounter (Signed)
done

## 2017-03-10 ENCOUNTER — Telehealth: Payer: Self-pay | Admitting: Radiology

## 2017-03-10 NOTE — Telephone Encounter (Signed)
Please notified patient that she has osteopenia. She should continue calcium and vitamin D and resistive exercises. We will repeat DEXA in 2 years.

## 2017-03-10 NOTE — Telephone Encounter (Signed)
Patient left a message, returning our call.

## 2017-03-10 NOTE — Telephone Encounter (Signed)
DEXA 02/02/17 shows T score -1.7 sent for scanning She is on Calcium and vitamin D

## 2017-03-11 NOTE — Telephone Encounter (Signed)
Patient advised of results and recommendations. Patient verbalized understanding.  

## 2017-04-02 ENCOUNTER — Telehealth: Payer: Self-pay | Admitting: Rheumatology

## 2017-04-02 MED ORDER — GABAPENTIN 300 MG PO CAPS: 600.0000 mg | ORAL_CAPSULE | Freq: Three times a day (TID) | ORAL | 1 refills | Status: DC

## 2017-04-02 NOTE — Telephone Encounter (Signed)
Patient calling, requesting a refill on Gabapentin. Patient uses [facility] on [address].

## 2017-04-02 NOTE — Telephone Encounter (Signed)
12/10/16 last visit  05/13/17 next visit  Okay to refill per Dr. Estanislado Pandy

## 2017-04-10 DIAGNOSIS — Z23 Encounter for immunization: Secondary | ICD-10-CM | POA: Diagnosis not present

## 2017-04-15 DIAGNOSIS — H43392 Other vitreous opacities, left eye: Secondary | ICD-10-CM | POA: Diagnosis not present

## 2017-04-15 DIAGNOSIS — H40013 Open angle with borderline findings, low risk, bilateral: Secondary | ICD-10-CM | POA: Diagnosis not present

## 2017-04-15 DIAGNOSIS — H2513 Age-related nuclear cataract, bilateral: Secondary | ICD-10-CM | POA: Diagnosis not present

## 2017-04-15 DIAGNOSIS — H04123 Dry eye syndrome of bilateral lacrimal glands: Secondary | ICD-10-CM | POA: Diagnosis not present

## 2017-04-16 DIAGNOSIS — H43392 Other vitreous opacities, left eye: Secondary | ICD-10-CM | POA: Diagnosis not present

## 2017-04-25 NOTE — Progress Notes (Signed)
Office Visit Note  Patient: [name]             Date of Birth: 26-Nov-1950           MRN: [id number]             PCP: Alroy Dust, L.Marlou Sa, MD Referring: Alroy Dust, Carlean Jews.Marlou Sa, MD Visit Date: 05/07/2017 Occupation: @GUAROCC @    Subjective:  Bilateral shoulder pain.   History of Present Illness: [name] is a 66 y.o. female with history of fibromyalgia and osteoarthritis. She states she recently moved to a new home and had been painting the walls. She's been having pain and discomfort in her right shoulder since May 2018. She seen her PCP who diagnosed her with tendinopathy. She states now she's having some discomfort in her left shoulder as well. She has some generalized discomfort from underlying osteoarthritis. She states her left ankle joint is a still bothersome. She was seen by podiatrist who recommended surgery. She is not interested in surgery and has modified her shoes.  Activities of Daily Living:  Patient reports morning stiffness for 1  hour.   Patient Reports nocturnal pain.  Difficulty dressing/grooming: Denies Difficulty climbing stairs: Reports Difficulty getting out of chair: Reports Difficulty using hands for taps, buttons, cutlery, and/or writing: Reports   Review of Systems  Constitutional: Positive for fatigue. Negative for night sweats, weight gain, weight loss and weakness.  HENT: Negative for mouth sores, trouble swallowing, trouble swallowing, mouth dryness and nose dryness.   Eyes: Positive for dryness. Negative for pain, redness and visual disturbance.  Respiratory: Negative for cough, shortness of breath and difficulty breathing.   Cardiovascular: Negative.  Negative for chest pain, palpitations, hypertension, irregular heartbeat and swelling in legs/feet.  Gastrointestinal: Negative.  Negative for blood in stool, constipation and diarrhea.  Endocrine: Negative.  Negative for increased urination.  Genitourinary: Positive for nocturia. Negative for  vaginal dryness.  Musculoskeletal: Positive for arthralgias, joint pain, myalgias, muscle weakness, morning stiffness and myalgias. Negative for joint swelling and muscle tenderness.  Skin: Positive for hair loss. Negative for color change, rash, skin tightness, ulcers and sensitivity to sunlight.  Allergic/Immunologic: Negative for susceptible to infections.  Neurological: Negative for dizziness, headaches, memory loss and night sweats.  Hematological: Negative.  Negative for swollen glands.  Psychiatric/Behavioral: Positive for depressed mood and sleep disturbance. The patient is nervous/anxious.     PMFS History:  Patient Active Problem List   Diagnosis Date Noted  . Posterior tibial tendinitis, left leg 06/20/2016  . Hallux rigidus, right foot 06/20/2016  . Hammer toes of both feet 06/20/2016  . Fatigue 06/04/2016  . Primary osteoarthritis of both hands 06/04/2016  . Age-related osteoporosis without current pathological fracture 06/04/2016  . DJD (degenerative joint disease), cervical 06/04/2016  . Osteoarthritis of lumbar spine 06/04/2016  . Primary osteoarthritis of both feet 06/04/2016  . Osteopenia 05/10/2015  . Depression 05/10/2015  . Primary osteoarthritis of knee 05/08/2015  . Fibromyalgia     Past Medical History:  Diagnosis Date  . Anxiety   . Arthritis   . Bruises easily   . Cataracts, bilateral    immature  . Chronic back pain    DDD  . Depression    takes Cymbalta daily  . Dry skin   . Family history of adverse reaction to anesthesia    mom wakes up fighting and sick  . Fibromyalgia   . GERD (gastroesophageal reflux disease)    occasionally but will take Tums if needed  .  Glaucoma   . History of bronchitis early Aug 2016  . History of shingles   . IBS (irritable bowel syndrome)   . Insomnia   . Joint pain   . Joint swelling   . Osteoarthritis   . Osteopenia   . Panic attacks   . PONV (postoperative nausea and vomiting)   . Urinary frequency   .  Urinary urgency   . Vertigo   . Weakness    in hands    Family History  Problem Relation Age of Onset  . Cancer Mother   . Osteoarthritis Mother    Past Surgical History:  Procedure Laterality Date  . ABDOMINAL HYSTERECTOMY  2007   with bladder sling  . COLONOSCOPY    . ESOPHAGOGASTRODUODENOSCOPY    . FOOT SURGERY Right   . LEG SURGERY Right   . TOTAL KNEE ARTHROPLASTY Right 05/08/2015  . TOTAL KNEE ARTHROPLASTY Right 05/08/2015   Procedure: TOTAL KNEE ARTHROPLASTY;  Surgeon: Garald Balding, MD;  Location: [facility];  Service: Orthopedics;  Laterality: Right;   Social History   Social History Narrative  . No narrative on file     Objective: Vital Signs: BP 139/67   Pulse 61   Resp 14   Ht 5\' 2"  (1.575 m)   Wt 202 lb (91.6 kg)   BMI 36.95 kg/m    Physical Exam  Constitutional: She is oriented to person, place, and time. She appears well-developed and well-nourished.  HENT:  Head: Normocephalic and atraumatic.  Eyes: Conjunctivae and EOM are normal.  Neck: Normal range of motion.  Cardiovascular: Normal rate, regular rhythm, normal heart sounds and intact distal pulses.   Pulmonary/Chest: Effort normal and breath sounds normal.  Abdominal: Soft. Bowel sounds are normal.  Lymphadenopathy:    She has no cervical adenopathy.  Neurological: She is alert and oriented to person, place, and time.  Skin: Skin is warm and dry. Capillary refill takes less than 2 seconds.  Psychiatric: She has a normal mood and affect. Her behavior is normal.  Nursing note and vitals reviewed.    Musculoskeletal Exam: C-spine and thoracic lumbar spine limited range of motion. Shoulder joints elbow joints wrist joint MCPs PIPs with good range of motion. She is some discomfort range of motion of bilateral shoulders. Hip joints knee joints ankles MTPs PIPs DIPs with good range of motion with no synovitis. Fibromyalgia tender points were 16 out of 18 positive she generalize hyperalgesia.  CDAI  Exam: No CDAI exam completed.    Investigation: Findings:  02/02/2017 DEXA showed T score of -1.70 and left femoral neck. BMD 0.660 this was the lowest score.  CBC Latest Ref Rng & Units 11/03/2016 05/10/2015 05/09/2015  WBC 3.8 - 10.8 K/uL 6.6 8.3 7.7  Hemoglobin 11.7 - 15.5 g/dL 13.6 10.4(L) 10.7(L)  Hematocrit 35.0 - 45.0 % 42.0 33.1(L) 33.2(L)  Platelets 140 - 400 K/uL 306 250 241   CMP Latest Ref Rng & Units 11/03/2016 05/10/2015 05/09/2015  Glucose 65 - 99 mg/dL 86 118(H) 113(H)  BUN 7 - 25 mg/dL 18 12 11   Creatinine 0.50 - 0.99 mg/dL 0.78 0.83 0.65  Sodium 135 - 146 mmol/L 140 134(L) 139  Potassium 3.5 - 5.3 mmol/L 4.7 4.7 4.7  Chloride 98 - 110 mmol/L 104 102 102  CO2 20 - 31 mmol/L 28 28 28   Calcium 8.6 - 10.4 mg/dL 9.6 8.3(L) 8.9  Total Protein 6.1 - 8.1 g/dL 7.2 - -  Total Bilirubin 0.2 - 1.2 mg/dL 0.3 - -  Alkaline Phos 33 - 130 U/L 65 - -  AST 10 - 35 U/L 22 - -  ALT 6 - 29 U/L 16 - -   Imaging: No results found.  Speciality Comments: No specialty comments available.    Procedures:  No procedures performed Allergies: Ace inhibitors; Aripiprazole; Cefixime; Codeine; Effexor [venlafaxine hydrochloride]; Escitalopram oxalate; Geodon [ziprasidone hydrochloride]; Lamictal [lamotrigine]; Lithium; Nortriptyline; Prozac [fluoxetine hcl]; Sertraline hcl; Erythromycin; Latex; and Penicillins   Assessment / Plan:     Visit Diagnoses: Fibromyalgia - She continues to have some generalized pain and discomfort. She states the medications do help. She is currently on Soma 350 mg by mouth daily at bedtime, Neurontin 300 mg 2 tablets by mouth 3 times a day. Possible tapering was discussed.  Bilateral shoulder pain: Patient appears to have tendinopathy due to painting her house. I've given her exercises for shoulder joint muscle strengthening. If she has persistent problems he will need physical therapy. She declines to go for physical therapy at this point.  History of fatigue:  She continues to have some fatigue.  Primary osteoarthritis of both knees: She has some discomfort in her knee joints  Primary osteoarthritis of both hands: Muscle strengthening and joint protection discussed.  Primary osteoarthritis of both feet: She continues to have some discomfort in her left ankle in bilateral feet. Proper fitting shoes has been helpful. She's been advised surgery for her left ankle which she's not interested in.  DDD (degenerative disc disease), cervical: She has limited range of motion and discomfort.  DDD (degenerative disc disease), lumbar: Chronic pain and discomfort.  Age-related osteoporosis without current pathological fracture - Fosamax 70 mg by mouth every week. She will discontinue Fosamax her most recent bone density from 02/02/2017 showed T score of -1.7. Now she is in the osteopenia range. Her vitamin D was low normal in the past we will check vitamin D level again today. She's been taking vitamin D supplement.  History of depression    Orders: Orders Placed This Encounter  Procedures  . CBC with Differential/Platelet  . COMPLETE METABOLIC PANEL WITH GFR  . VITAMIN D 25 Hydroxy (Vit-D Deficiency, Fractures)   No orders of the defined types were placed in this encounter.   Face-to-face time spent with patient was 30 minutes. Greater than 50% of time was spent in counseling and coordination of care.  Follow-Up Instructions: Return in about 6 months (around 11/05/2017) for FMS DDD OA.   Bo Merino, MD  Note - This record has been created using Editor, commissioning.  Chart creation errors have been sought, but may not always  have been located. Such creation errors do not reflect on  the standard of medical care.

## 2017-05-07 ENCOUNTER — Encounter: Payer: Self-pay | Admitting: Rheumatology

## 2017-05-07 ENCOUNTER — Ambulatory Visit (INDEPENDENT_AMBULATORY_CARE_PROVIDER_SITE_OTHER): Payer: Medicare Other | Admitting: Rheumatology

## 2017-05-07 VITALS — BP 139/67 | HR 61 | Resp 14 | Ht 62.0 in | Wt 202.0 lb

## 2017-05-07 DIAGNOSIS — M170 Bilateral primary osteoarthritis of knee: Secondary | ICD-10-CM | POA: Diagnosis not present

## 2017-05-07 DIAGNOSIS — Z87898 Personal history of other specified conditions: Secondary | ICD-10-CM | POA: Diagnosis not present

## 2017-05-07 DIAGNOSIS — M5030 Other cervical disc degeneration, unspecified cervical region: Secondary | ICD-10-CM | POA: Diagnosis not present

## 2017-05-07 DIAGNOSIS — M810 Age-related osteoporosis without current pathological fracture: Secondary | ICD-10-CM

## 2017-05-07 DIAGNOSIS — M25512 Pain in left shoulder: Secondary | ICD-10-CM

## 2017-05-07 DIAGNOSIS — Z8659 Personal history of other mental and behavioral disorders: Secondary | ICD-10-CM

## 2017-05-07 DIAGNOSIS — M797 Fibromyalgia: Secondary | ICD-10-CM

## 2017-05-07 DIAGNOSIS — M5136 Other intervertebral disc degeneration, lumbar region: Secondary | ICD-10-CM

## 2017-05-07 DIAGNOSIS — M19041 Primary osteoarthritis, right hand: Secondary | ICD-10-CM | POA: Diagnosis not present

## 2017-05-07 DIAGNOSIS — G8929 Other chronic pain: Secondary | ICD-10-CM

## 2017-05-07 DIAGNOSIS — M25511 Pain in right shoulder: Secondary | ICD-10-CM | POA: Diagnosis not present

## 2017-05-07 DIAGNOSIS — M19071 Primary osteoarthritis, right ankle and foot: Secondary | ICD-10-CM

## 2017-05-07 DIAGNOSIS — M19042 Primary osteoarthritis, left hand: Secondary | ICD-10-CM | POA: Diagnosis not present

## 2017-05-07 DIAGNOSIS — Z5181 Encounter for therapeutic drug level monitoring: Secondary | ICD-10-CM | POA: Diagnosis not present

## 2017-05-07 DIAGNOSIS — M19072 Primary osteoarthritis, left ankle and foot: Secondary | ICD-10-CM

## 2017-05-07 NOTE — Patient Instructions (Signed)
Shoulder Exercises Ask your health care provider which exercises are safe for you. Do exercises exactly as told by your health care provider and adjust them as directed. It is normal to feel mild stretching, pulling, tightness, or discomfort as you do these exercises, but you should stop right away if you feel sudden pain or your pain gets worse.Do not begin these exercises until told by your health care provider. RANGE OF MOTION EXERCISES These exercises warm up your muscles and joints and improve the movement and flexibility of your shoulder. These exercises also help to relieve pain, numbness, and tingling. These exercises involve stretching your injured shoulder directly. Exercise A: Pendulum  1. Stand near a wall or a surface that you can hold onto for balance. 2. Bend at the waist and let your left / right arm hang straight down. Use your other arm to support you. Keep your back straight and do not lock your knees. 3. Relax your left / right arm and shoulder muscles, and move your hips and your trunk so your left / right arm swings freely. Your arm should swing because of the motion of your body, not because you are using your arm or shoulder muscles. 4. Keep moving your body so your arm swings in the following directions, as told by your health care provider: ? Side to side. ? Forward and backward. ? In clockwise and counterclockwise circles. 5. Continue each motion for __________ seconds, or for as long as told by your health care provider. 6. Slowly return to the starting position. Repeat __________ times. Complete this exercise __________ times a day. Exercise B:Flexion, Standing  1. Stand and hold a broomstick, a cane, or a similar object. Place your hands a little more than shoulder-width apart on the object. Your left / right hand should be palm-up, and your other hand should be palm-down. 2. Keep your elbow straight and keep your shoulder muscles relaxed. Push the stick down with  your healthy arm to raise your left / right arm in front of your body, and then over your head until you feel a stretch in your shoulder. ? Avoid shrugging your shoulder while you raise your arm. Keep your shoulder blade tucked down toward the middle of your back. 3. Hold for __________ seconds. 4. Slowly return to the starting position. Repeat __________ times. Complete this exercise __________ times a day. Exercise C: Abduction, Standing 1. Stand and hold a broomstick, a cane, or a similar object. Place your hands a little more than shoulder-width apart on the object. Your left / right hand should be palm-up, and your other hand should be palm-down. 2. While keeping your elbow straight and your shoulder muscles relaxed, push the stick across your body toward your left / right side. Raise your left / right arm to the side of your body and then over your head until you feel a stretch in your shoulder. ? Do not raise your arm above shoulder height, unless your health care provider tells you to do that. ? Avoid shrugging your shoulder while you raise your arm. Keep your shoulder blade tucked down toward the middle of your back. 3. Hold for __________ seconds. 4. Slowly return to the starting position. Repeat __________ times. Complete this exercise __________ times a day. Exercise D:Internal Rotation  1. Place your left / right hand behind your back, palm-up. 2. Use your other hand to dangle an exercise band, a towel, or a similar object over your shoulder. Grasp the band with   your left / right hand so you are holding onto both ends. 3. Gently pull up on the band until you feel a stretch in the front of your left / right shoulder. ? Avoid shrugging your shoulder while you raise your arm. Keep your shoulder blade tucked down toward the middle of your back. 4. Hold for __________ seconds. 5. Release the stretch by letting go of the band and lowering your hands. Repeat __________ times. Complete  this exercise __________ times a day. STRETCHING EXERCISES These exercises warm up your muscles and joints and improve the movement and flexibility of your shoulder. These exercises also help to relieve pain, numbness, and tingling. These exercises are done using your healthy shoulder to help stretch the muscles of your injured shoulder. Exercise E: Corner Stretch (External Rotation and Abduction)  1. Stand in a doorway with one of your feet slightly in front of the other. This is called a staggered stance. If you cannot reach your forearms to the door frame, stand facing a corner of a room. 2. Choose one of the following positions as told by your health care provider: ? Place your hands and forearms on the door frame above your head. ? Place your hands and forearms on the door frame at the height of your head. ? Place your hands on the door frame at the height of your elbows. 3. Slowly move your weight onto your front foot until you feel a stretch across your chest and in the front of your shoulders. Keep your head and chest upright and keep your abdominal muscles tight. 4. Hold for __________ seconds. 5. To release the stretch, shift your weight to your back foot. Repeat __________ times. Complete this stretch __________ times a day. Exercise F:Extension, Standing 1. Stand and hold a broomstick, a cane, or a similar object behind your back. ? Your hands should be a little wider than shoulder-width apart. ? Your palms should face away from your back. 2. Keeping your elbows straight and keeping your shoulder muscles relaxed, move the stick away from your body until you feel a stretch in your shoulder. ? Avoid shrugging your shoulders while you move the stick. Keep your shoulder blade tucked down toward the middle of your back. 3. Hold for __________ seconds. 4. Slowly return to the starting position. Repeat __________ times. Complete this exercise __________ times a day. STRENGTHENING  EXERCISES These exercises build strength and endurance in your shoulder. Endurance is the ability to use your muscles for a long time, even after they get tired. Exercise G:External Rotation  1. Sit in a stable chair without armrests. 2. Secure an exercise band at elbow height on your left / right side. 3. Place a soft object, such as a folded towel or a small pillow, between your left / right upper arm and your body to move your elbow a few inches away (about 10 cm) from your side. 4. Hold the end of the band so it is tight and there is no slack. 5. Keeping your elbow pressed against the soft object, move your left / right forearm out, away from your abdomen. Keep your body steady so only your forearm moves. 6. Hold for __________ seconds. 7. Slowly return to the starting position. Repeat __________ times. Complete this exercise __________ times a day. Exercise H:Shoulder Abduction  1. Sit in a stable chair without armrests, or stand. 2. Hold a __________ weight in your left / right hand, or hold an exercise band with both hands.   3. Start with your arms straight down and your left / right palm facing in, toward your body. 4. Slowly lift your left / right hand out to your side. Do not lift your hand above shoulder height unless your health care provider tells you that this is safe. ? Keep your arms straight. ? Avoid shrugging your shoulder while you do this movement. Keep your shoulder blade tucked down toward the middle of your back. 5. Hold for __________ seconds. 6. Slowly lower your arm, and return to the starting position. Repeat __________ times. Complete this exercise __________ times a day. Exercise I:Shoulder Extension 1. Sit in a stable chair without armrests, or stand. 2. Secure an exercise band to a stable object in front of you where it is at shoulder height. 3. Hold one end of the exercise band in each hand. Your palms should face each other. 4. Straighten your elbows and  lift your hands up to shoulder height. 5. Step back, away from the secured end of the exercise band, until the band is tight and there is no slack. 6. Squeeze your shoulder blades together as you pull your hands down to the sides of your thighs. Stop when your hands are straight down by your sides. Do not let your hands go behind your body. 7. Hold for __________ seconds. 8. Slowly return to the starting position. Repeat __________ times. Complete this exercise __________ times a day. Exercise J:Standing Shoulder Row 1. Sit in a stable chair without armrests, or stand. 2. Secure an exercise band to a stable object in front of you so it is at waist height. 3. Hold one end of the exercise band in each hand. Your palms should be in a thumbs-up position. 4. Bend each of your elbows to an "L" shape (about 90 degrees) and keep your upper arms at your sides. 5. Step back until the band is tight and there is no slack. 6. Slowly pull your elbows back behind you. 7. Hold for __________ seconds. 8. Slowly return to the starting position. Repeat __________ times. Complete this exercise __________ times a day. Exercise K:Shoulder Press-Ups  1. Sit in a stable chair that has armrests. Sit upright, with your feet flat on the floor. 2. Put your hands on the armrests so your elbows are bent and your fingers are pointing forward. Your hands should be about even with the sides of your body. 3. Push down on the armrests and use your arms to lift yourself off of the chair. Straighten your elbows and lift yourself up as much as you comfortably can. ? Move your shoulder blades down, and avoid letting your shoulders move up toward your ears. ? Keep your feet on the ground. As you get stronger, your feet should support less of your body weight as you lift yourself up. 4. Hold for __________ seconds. 5. Slowly lower yourself back into the chair. Repeat __________ times. Complete this exercise __________ times a  day. Exercise L: Wall Push-Ups  1. Stand so you are facing a stable wall. Your feet should be about one arm-length away from the wall. 2. Lean forward and place your palms on the wall at shoulder height. 3. Keep your feet flat on the floor as you bend your elbows and lean forward toward the wall. 4. Hold for __________ seconds. 5. Straighten your elbows to push yourself back to the starting position. Repeat __________ times. Complete this exercise __________ times a day. This information is not intended to replace advice   given to you by your health care provider. Make sure you discuss any questions you have with your health care provider. Document Released: 05/14/2005 Document Revised: 03/24/2016 Document Reviewed: 03/11/2015 Elsevier Interactive Patient Education  2018 [employer]  

## 2017-05-08 ENCOUNTER — Telehealth: Payer: Self-pay | Admitting: Rheumatology

## 2017-05-08 DIAGNOSIS — Z79899 Other long term (current) drug therapy: Secondary | ICD-10-CM

## 2017-05-08 LAB — COMPLETE METABOLIC PANEL WITH GFR
AG RATIO: 1.5 (calc) (ref 1.0–2.5)
ALT: 17 U/L (ref 6–29)
AST: 21 U/L (ref 10–35)
Albumin: 4.1 g/dL (ref 3.6–5.1)
Alkaline phosphatase (APISO): 59 U/L (ref 33–130)
BILIRUBIN TOTAL: 0.5 mg/dL (ref 0.2–1.2)
BUN: 18 mg/dL (ref 7–25)
CHLORIDE: 101 mmol/L (ref 98–110)
CO2: 31 mmol/L (ref 20–32)
Calcium: 9.2 mg/dL (ref 8.6–10.4)
Creat: 0.82 mg/dL (ref 0.50–0.99)
GFR, EST AFRICAN AMERICAN: 87 mL/min/{1.73_m2} (ref >=60)
GFR, Est Non African American: 75 mL/min/{1.73_m2} (ref >=60)
GLOBULIN: 2.8 g/dL (ref 1.9–3.7)
Glucose, Bld: 100 mg/dL — ABNORMAL HIGH (ref 65–99)
POTASSIUM: 6 mmol/L — AB (ref 3.5–5.3)
SODIUM: 137 mmol/L (ref 135–146)
TOTAL PROTEIN: 6.9 g/dL (ref 6.1–8.1)

## 2017-05-08 LAB — CBC WITH DIFFERENTIAL/PLATELET
Basophils Absolute: 60 cells/uL (ref 0–200)
Basophils Relative: 1 %
EOS ABS: 162 {cells}/uL (ref 15–500)
Eosinophils Relative: 2.7 %
HCT: 40.8 % (ref 35.0–45.0)
Hemoglobin: 13.8 g/dL (ref 11.7–15.5)
Lymphs Abs: 1542 cells/uL (ref 850–3900)
MCH: 32.1 pg (ref 27.0–33.0)
MCHC: 33.8 g/dL (ref 32.0–36.0)
MCV: 94.9 fL (ref 80.0–100.0)
MPV: 9.5 fL (ref 7.5–12.5)
Monocytes Relative: 11.1 %
NEUTROS PCT: 59.5 %
Neutro Abs: 3570 cells/uL (ref 1500–7800)
PLATELETS: 303 10*3/uL (ref 140–400)
RBC: 4.3 10*6/uL (ref 3.80–5.10)
RDW: 13.2 % (ref 11.0–15.0)
TOTAL LYMPHOCYTE: 25.7 %
WBC: 6 10*3/uL (ref 3.8–10.8)
WBCMIX: 666 {cells}/uL (ref 200–950)

## 2017-05-08 LAB — VITAMIN D 25 HYDROXY (VIT D DEFICIENCY, FRACTURES): Vit D, 25-Hydroxy: 35 ng/mL (ref 30–100)

## 2017-05-08 NOTE — Progress Notes (Signed)
Potassium is high most likely it's hemolyzed. Please check with patient if she is taking potassium supplement. She can have her BMP repeated here or with her PCP. Vitamin D is stable. She should continue with vitamin D supplement.

## 2017-05-08 NOTE — Telephone Encounter (Signed)
-----   Message from Bo Merino, MD sent at 05/08/2017  1:41 PM EDT ----- Potassium is high most likely it's hemolyzed. Please check with patient if she is taking potassium supplement. She can have her BMP repeated here or with her PCP. Vitamin D is stable. She should continue with vitamin D supplement.

## 2017-05-13 ENCOUNTER — Ambulatory Visit: Payer: Medicare HMO | Admitting: Rheumatology

## 2017-05-13 DIAGNOSIS — E875 Hyperkalemia: Secondary | ICD-10-CM | POA: Diagnosis not present

## 2017-05-13 DIAGNOSIS — J069 Acute upper respiratory infection, unspecified: Secondary | ICD-10-CM | POA: Diagnosis not present

## 2017-06-10 DIAGNOSIS — H2513 Age-related nuclear cataract, bilateral: Secondary | ICD-10-CM | POA: Diagnosis not present

## 2017-06-10 DIAGNOSIS — H40013 Open angle with borderline findings, low risk, bilateral: Secondary | ICD-10-CM | POA: Diagnosis not present

## 2017-06-10 DIAGNOSIS — H04123 Dry eye syndrome of bilateral lacrimal glands: Secondary | ICD-10-CM | POA: Diagnosis not present

## 2017-07-23 ENCOUNTER — Telehealth: Payer: Self-pay | Admitting: Rheumatology

## 2017-07-23 MED ORDER — GABAPENTIN 300 MG PO CAPS: 600.0000 mg | ORAL_CAPSULE | Freq: Three times a day (TID) | ORAL | 1 refills | Status: DC

## 2017-07-23 NOTE — Telephone Encounter (Signed)
Patient left a voicemail requesting a prescription refill of Gabapentin.  Pharmacy is [facility] on [address].  Pt's CB# [phone]

## 2017-07-23 NOTE — Telephone Encounter (Signed)
Last Visit: 05/07/17 Next Visit: 11/09/17  Okay to refill per Dr. Estanislado Pandy

## 2017-09-04 DIAGNOSIS — M1990 Unspecified osteoarthritis, unspecified site: Secondary | ICD-10-CM | POA: Diagnosis not present

## 2017-09-04 DIAGNOSIS — M797 Fibromyalgia: Secondary | ICD-10-CM | POA: Diagnosis not present

## 2017-09-15 DIAGNOSIS — M79672 Pain in left foot: Secondary | ICD-10-CM | POA: Diagnosis not present

## 2017-09-15 DIAGNOSIS — M79642 Pain in left hand: Secondary | ICD-10-CM | POA: Diagnosis not present

## 2017-09-15 DIAGNOSIS — M19071 Primary osteoarthritis, right ankle and foot: Secondary | ICD-10-CM | POA: Diagnosis not present

## 2017-09-15 DIAGNOSIS — M2550 Pain in unspecified joint: Secondary | ICD-10-CM | POA: Diagnosis not present

## 2017-09-15 DIAGNOSIS — M79671 Pain in right foot: Secondary | ICD-10-CM | POA: Diagnosis not present

## 2017-09-15 DIAGNOSIS — M50322 Other cervical disc degeneration at C5-C6 level: Secondary | ICD-10-CM | POA: Diagnosis not present

## 2017-09-15 DIAGNOSIS — M19041 Primary osteoarthritis, right hand: Secondary | ICD-10-CM | POA: Diagnosis not present

## 2017-09-15 DIAGNOSIS — M19072 Primary osteoarthritis, left ankle and foot: Secondary | ICD-10-CM | POA: Diagnosis not present

## 2017-09-15 DIAGNOSIS — M25519 Pain in unspecified shoulder: Secondary | ICD-10-CM | POA: Diagnosis not present

## 2017-09-15 DIAGNOSIS — M79643 Pain in unspecified hand: Secondary | ICD-10-CM | POA: Diagnosis not present

## 2017-09-15 DIAGNOSIS — M797 Fibromyalgia: Secondary | ICD-10-CM | POA: Diagnosis not present

## 2017-09-15 DIAGNOSIS — M545 Low back pain: Secondary | ICD-10-CM | POA: Diagnosis not present

## 2017-09-15 DIAGNOSIS — M79641 Pain in right hand: Secondary | ICD-10-CM | POA: Diagnosis not present

## 2017-09-15 DIAGNOSIS — M1990 Unspecified osteoarthritis, unspecified site: Secondary | ICD-10-CM | POA: Diagnosis not present

## 2017-09-15 DIAGNOSIS — M50323 Other cervical disc degeneration at C6-C7 level: Secondary | ICD-10-CM | POA: Diagnosis not present

## 2017-09-15 DIAGNOSIS — M47812 Spondylosis without myelopathy or radiculopathy, cervical region: Secondary | ICD-10-CM | POA: Diagnosis not present

## 2017-09-15 DIAGNOSIS — M7581 Other shoulder lesions, right shoulder: Secondary | ICD-10-CM | POA: Diagnosis not present

## 2017-09-15 DIAGNOSIS — M19042 Primary osteoarthritis, left hand: Secondary | ICD-10-CM | POA: Diagnosis not present

## 2017-09-17 DIAGNOSIS — J019 Acute sinusitis, unspecified: Secondary | ICD-10-CM | POA: Diagnosis not present

## 2017-10-05 ENCOUNTER — Ambulatory Visit (INDEPENDENT_AMBULATORY_CARE_PROVIDER_SITE_OTHER): Payer: Medicare HMO | Admitting: Orthopaedic Surgery

## 2017-10-06 DIAGNOSIS — M25519 Pain in unspecified shoulder: Secondary | ICD-10-CM | POA: Diagnosis not present

## 2017-10-06 DIAGNOSIS — M1990 Unspecified osteoarthritis, unspecified site: Secondary | ICD-10-CM | POA: Diagnosis not present

## 2017-10-06 DIAGNOSIS — M2550 Pain in unspecified joint: Secondary | ICD-10-CM | POA: Diagnosis not present

## 2017-10-06 DIAGNOSIS — R700 Elevated erythrocyte sedimentation rate: Secondary | ICD-10-CM | POA: Diagnosis not present

## 2017-10-06 DIAGNOSIS — R7982 Elevated C-reactive protein (CRP): Secondary | ICD-10-CM | POA: Diagnosis not present

## 2017-10-06 DIAGNOSIS — M79643 Pain in unspecified hand: Secondary | ICD-10-CM | POA: Diagnosis not present

## 2017-10-06 DIAGNOSIS — M130 Polyarthritis, unspecified: Secondary | ICD-10-CM | POA: Diagnosis not present

## 2017-10-06 DIAGNOSIS — M797 Fibromyalgia: Secondary | ICD-10-CM | POA: Diagnosis not present

## 2017-10-06 DIAGNOSIS — M7581 Other shoulder lesions, right shoulder: Secondary | ICD-10-CM | POA: Diagnosis not present

## 2017-10-12 ENCOUNTER — Ambulatory Visit (INDEPENDENT_AMBULATORY_CARE_PROVIDER_SITE_OTHER): Payer: PRIVATE HEALTH INSURANCE | Admitting: Orthopaedic Surgery

## 2017-10-26 DIAGNOSIS — H04123 Dry eye syndrome of bilateral lacrimal glands: Secondary | ICD-10-CM | POA: Diagnosis not present

## 2017-10-26 DIAGNOSIS — H40013 Open angle with borderline findings, low risk, bilateral: Secondary | ICD-10-CM | POA: Diagnosis not present

## 2017-10-26 DIAGNOSIS — H2513 Age-related nuclear cataract, bilateral: Secondary | ICD-10-CM | POA: Diagnosis not present

## 2017-10-26 NOTE — Progress Notes (Deleted)
Office Visit Note  Patient: [name]             Date of Birth: 05/21/1951           MRN: [id number]             PCP: Alroy Dust, L.Marlou Sa, MD Referring: Alroy Dust, Carlean Jews.Marlou Sa, MD Visit Date: 11/09/2017 Occupation: @GUAROCC @    Subjective:  No chief complaint on file.   History of Present Illness: [name] is a 67 y.o. female ***   Activities of Daily Living:  Patient reports morning stiffness for *** {minute/hour:19697}.   Patient {ACTIONS;DENIES/REPORTS:21021675::"Denies"} nocturnal pain.  Difficulty dressing/grooming: {ACTIONS;DENIES/REPORTS:21021675::"Denies"} Difficulty climbing stairs: {ACTIONS;DENIES/REPORTS:21021675::"Denies"} Difficulty getting out of chair: {ACTIONS;DENIES/REPORTS:21021675::"Denies"} Difficulty using hands for taps, buttons, cutlery, and/or writing: {ACTIONS;DENIES/REPORTS:21021675::"Denies"}   No Rheumatology ROS completed.   PMFS History:  Patient Active Problem List   Diagnosis Date Noted  . Posterior tibial tendinitis, left leg 06/20/2016  . Hallux rigidus, right foot 06/20/2016  . Hammer toes of both feet 06/20/2016  . Fatigue 06/04/2016  . Primary osteoarthritis of both hands 06/04/2016  . Age-related osteoporosis without current pathological fracture 06/04/2016  . DJD (degenerative joint disease), cervical 06/04/2016  . Osteoarthritis of lumbar spine 06/04/2016  . Primary osteoarthritis of both feet 06/04/2016  . Osteopenia 05/10/2015  . Depression 05/10/2015  . Primary osteoarthritis of knee 05/08/2015  . Fibromyalgia     Past Medical History:  Diagnosis Date  . Anxiety   . Arthritis   . Bruises easily   . Cataracts, bilateral    immature  . Chronic back pain    DDD  . Depression    takes Cymbalta daily  . Dry skin   . Family history of adverse reaction to anesthesia    mom wakes up fighting and sick  . Fibromyalgia   . GERD (gastroesophageal reflux disease)    occasionally but will take Tums if needed  . Glaucoma    . History of bronchitis early Aug 2016  . History of shingles   . IBS (irritable bowel syndrome)   . Insomnia   . Joint pain   . Joint swelling   . Osteoarthritis   . Osteopenia   . Panic attacks   . PONV (postoperative nausea and vomiting)   . Urinary frequency   . Urinary urgency   . Vertigo   . Weakness    in hands    Family History  Problem Relation Age of Onset  . Cancer Mother   . Osteoarthritis Mother    Past Surgical History:  Procedure Laterality Date  . ABDOMINAL HYSTERECTOMY  2007   with bladder sling  . COLONOSCOPY    . ESOPHAGOGASTRODUODENOSCOPY    . FOOT SURGERY Right   . LEG SURGERY Right   . TOTAL KNEE ARTHROPLASTY Right 05/08/2015  . TOTAL KNEE ARTHROPLASTY Right 05/08/2015   Procedure: TOTAL KNEE ARTHROPLASTY;  Surgeon: Garald Balding, MD;  Location: [facility];  Service: Orthopedics;  Laterality: Right;   Social History   Social History Narrative  . Not on file     Objective: Vital Signs: There were no vitals taken for this visit.   Physical Exam   Musculoskeletal Exam: ***  CDAI Exam: No CDAI exam completed.    Investigation: No additional findings. CBC Latest Ref Rng & Units 05/07/2017 11/03/2016 05/10/2015  WBC 3.8 - 10.8 Thousand/uL 6.0 6.6 8.3  Hemoglobin 11.7 - 15.5 g/dL 13.8 13.6 10.4(L)  Hematocrit 35.0 - 45.0 % 40.8 42.0 33.1(L)  Platelets  140 - 400 Thousand/uL 303 306 250   CMP Latest Ref Rng & Units 05/07/2017 11/03/2016 05/10/2015  Glucose 65 - 99 mg/dL 100(H) 86 118(H)  BUN 7 - 25 mg/dL 18 18 12   Creatinine 0.50 - 0.99 mg/dL 0.82 0.78 0.83  Sodium 135 - 146 mmol/L 137 140 134(L)  Potassium 3.5 - 5.3 mmol/L 6.0(H) 4.7 4.7  Chloride 98 - 110 mmol/L 101 104 102  CO2 20 - 32 mmol/L 31 28 28   Calcium 8.6 - 10.4 mg/dL 9.2 9.6 8.3(L)  Total Protein 6.1 - 8.1 g/dL 6.9 7.2 -  Total Bilirubin 0.2 - 1.2 mg/dL 0.5 0.3 -  Alkaline Phos 33 - 130 U/L - 65 -  AST 10 - 35 U/L 21 22 -  ALT 6 - 29 U/L 17 16 -    Imaging: No  results found.  Speciality Comments: No specialty comments available.    Procedures:  No procedures performed Allergies: Ace inhibitors; Aripiprazole; Cefixime; Codeine; Effexor [venlafaxine hydrochloride]; Escitalopram oxalate; Geodon [ziprasidone hydrochloride]; Lamictal [lamotrigine]; Lithium; Nortriptyline; Prozac [fluoxetine hcl]; Sertraline hcl; Erythromycin; Latex; and Penicillins   Assessment / Plan:     Visit Diagnoses: Fibromyalgia - Soma 350 mg at bedtime, Neurontin 300 mg 2 tablets TID   Primary osteoarthritis of both hands  Primary osteoarthritis of both knees  Primary osteoarthritis of both feet  DDD (degenerative disc disease), cervical  DDD (degenerative disc disease), lumbar  Other fatigue  Age-related osteoporosis without current pathological fracture - Fosamax 70 mg by mouth every weekDEXA 02/02/17 T-score -1.7  History of depression    Orders: No orders of the defined types were placed in this encounter.  No orders of the defined types were placed in this encounter.   Face-to-face time spent with patient was *** minutes. 50% of time was spent in counseling and coordination of care.  Follow-Up Instructions: No follow-ups on file.   Ofilia Neas, PA-C  Note - This record has been created using Dragon software.  Chart creation errors have been sought, but may not always  have been located. Such creation errors do not reflect on  the standard of medical care.

## 2017-11-04 ENCOUNTER — Telehealth: Payer: Self-pay | Admitting: Rheumatology

## 2017-11-04 NOTE — Telephone Encounter (Signed)
Patient advised that her last labs were in October 2018 and her sed rate was not checked. Patient verbalized understanding.

## 2017-11-04 NOTE — Telephone Encounter (Signed)
Patient had to cancel upcoming appt on Monday due to her schedule. Patient would like for someone to look at last lab results to see if her inflammation is up, and give her a call with this information. Please leave a detailed message on patients # if she is not able to answer.

## 2017-11-09 ENCOUNTER — Ambulatory Visit: Payer: PRIVATE HEALTH INSURANCE | Admitting: Physician Assistant

## 2017-11-09 ENCOUNTER — Ambulatory Visit: Payer: PRIVATE HEALTH INSURANCE | Admitting: Rheumatology

## 2017-11-12 DIAGNOSIS — M797 Fibromyalgia: Secondary | ICD-10-CM | POA: Diagnosis not present

## 2017-11-12 DIAGNOSIS — M7581 Other shoulder lesions, right shoulder: Secondary | ICD-10-CM | POA: Diagnosis not present

## 2017-11-12 DIAGNOSIS — M1990 Unspecified osteoarthritis, unspecified site: Secondary | ICD-10-CM | POA: Diagnosis not present

## 2017-11-12 DIAGNOSIS — R7982 Elevated C-reactive protein (CRP): Secondary | ICD-10-CM | POA: Diagnosis not present

## 2017-11-12 DIAGNOSIS — M2550 Pain in unspecified joint: Secondary | ICD-10-CM | POA: Diagnosis not present

## 2017-11-12 DIAGNOSIS — M79643 Pain in unspecified hand: Secondary | ICD-10-CM | POA: Diagnosis not present

## 2017-11-12 DIAGNOSIS — M25519 Pain in unspecified shoulder: Secondary | ICD-10-CM | POA: Diagnosis not present

## 2017-11-12 DIAGNOSIS — R700 Elevated erythrocyte sedimentation rate: Secondary | ICD-10-CM | POA: Diagnosis not present

## 2018-02-18 DIAGNOSIS — L299 Pruritus, unspecified: Secondary | ICD-10-CM | POA: Diagnosis not present

## 2018-03-03 DIAGNOSIS — H04123 Dry eye syndrome of bilateral lacrimal glands: Secondary | ICD-10-CM | POA: Diagnosis not present

## 2018-03-03 DIAGNOSIS — H2513 Age-related nuclear cataract, bilateral: Secondary | ICD-10-CM | POA: Diagnosis not present

## 2018-03-03 DIAGNOSIS — H40013 Open angle with borderline findings, low risk, bilateral: Secondary | ICD-10-CM | POA: Diagnosis not present

## 2018-03-03 DIAGNOSIS — H02206 Unspecified lagophthalmos left eye, unspecified eyelid: Secondary | ICD-10-CM | POA: Diagnosis not present

## 2018-03-03 DIAGNOSIS — H02203 Unspecified lagophthalmos right eye, unspecified eyelid: Secondary | ICD-10-CM | POA: Diagnosis not present

## 2018-03-05 DIAGNOSIS — Z23 Encounter for immunization: Secondary | ICD-10-CM | POA: Diagnosis not present

## 2018-04-13 DIAGNOSIS — M797 Fibromyalgia: Secondary | ICD-10-CM | POA: Diagnosis not present

## 2018-04-13 DIAGNOSIS — F39 Unspecified mood [affective] disorder: Secondary | ICD-10-CM | POA: Diagnosis not present

## 2018-04-13 DIAGNOSIS — M5136 Other intervertebral disc degeneration, lumbar region: Secondary | ICD-10-CM | POA: Diagnosis not present

## 2018-04-13 DIAGNOSIS — I10 Essential (primary) hypertension: Secondary | ICD-10-CM | POA: Diagnosis not present

## 2018-04-13 DIAGNOSIS — F411 Generalized anxiety disorder: Secondary | ICD-10-CM | POA: Diagnosis not present

## 2018-04-13 DIAGNOSIS — R5383 Other fatigue: Secondary | ICD-10-CM | POA: Diagnosis not present

## 2018-04-13 DIAGNOSIS — F4310 Post-traumatic stress disorder, unspecified: Secondary | ICD-10-CM | POA: Diagnosis not present

## 2018-04-13 DIAGNOSIS — R7301 Impaired fasting glucose: Secondary | ICD-10-CM | POA: Diagnosis not present

## 2018-04-13 DIAGNOSIS — K589 Irritable bowel syndrome without diarrhea: Secondary | ICD-10-CM | POA: Diagnosis not present

## 2018-04-21 ENCOUNTER — Other Ambulatory Visit: Payer: Self-pay | Admitting: Family Medicine

## 2018-04-21 DIAGNOSIS — Z1231 Encounter for screening mammogram for malignant neoplasm of breast: Secondary | ICD-10-CM

## 2018-05-04 DIAGNOSIS — M1990 Unspecified osteoarthritis, unspecified site: Secondary | ICD-10-CM | POA: Diagnosis not present

## 2018-05-04 DIAGNOSIS — M8580 Other specified disorders of bone density and structure, unspecified site: Secondary | ICD-10-CM | POA: Diagnosis not present

## 2018-05-04 DIAGNOSIS — Z124 Encounter for screening for malignant neoplasm of cervix: Secondary | ICD-10-CM | POA: Diagnosis not present

## 2018-05-04 DIAGNOSIS — F39 Unspecified mood [affective] disorder: Secondary | ICD-10-CM | POA: Diagnosis not present

## 2018-05-04 DIAGNOSIS — H409 Unspecified glaucoma: Secondary | ICD-10-CM | POA: Diagnosis not present

## 2018-05-04 DIAGNOSIS — Z01419 Encounter for gynecological examination (general) (routine) without abnormal findings: Secondary | ICD-10-CM | POA: Diagnosis not present

## 2018-05-10 DIAGNOSIS — F419 Anxiety disorder, unspecified: Secondary | ICD-10-CM | POA: Diagnosis not present

## 2018-05-10 DIAGNOSIS — Z1211 Encounter for screening for malignant neoplasm of colon: Secondary | ICD-10-CM | POA: Diagnosis not present

## 2018-05-10 DIAGNOSIS — H409 Unspecified glaucoma: Secondary | ICD-10-CM | POA: Diagnosis not present

## 2018-05-12 ENCOUNTER — Telehealth (INDEPENDENT_AMBULATORY_CARE_PROVIDER_SITE_OTHER): Payer: Self-pay | Admitting: Rheumatology

## 2018-05-12 NOTE — Telephone Encounter (Signed)
Patient called wanting to pickup copy of records/release is in chart. Copy ready at front desk. 2018 Bone Density report faxed to Dr Darron Doom office [phone]

## 2018-05-17 DIAGNOSIS — M797 Fibromyalgia: Secondary | ICD-10-CM | POA: Diagnosis not present

## 2018-05-17 DIAGNOSIS — M25519 Pain in unspecified shoulder: Secondary | ICD-10-CM | POA: Diagnosis not present

## 2018-05-17 DIAGNOSIS — M79643 Pain in unspecified hand: Secondary | ICD-10-CM | POA: Diagnosis not present

## 2018-05-17 DIAGNOSIS — M25572 Pain in left ankle and joints of left foot: Secondary | ICD-10-CM | POA: Diagnosis not present

## 2018-05-17 DIAGNOSIS — M2550 Pain in unspecified joint: Secondary | ICD-10-CM | POA: Diagnosis not present

## 2018-05-17 DIAGNOSIS — R700 Elevated erythrocyte sedimentation rate: Secondary | ICD-10-CM | POA: Diagnosis not present

## 2018-05-17 DIAGNOSIS — E669 Obesity, unspecified: Secondary | ICD-10-CM | POA: Diagnosis not present

## 2018-05-17 DIAGNOSIS — M7581 Other shoulder lesions, right shoulder: Secondary | ICD-10-CM | POA: Diagnosis not present

## 2018-05-17 DIAGNOSIS — M2041 Other hammer toe(s) (acquired), right foot: Secondary | ICD-10-CM | POA: Diagnosis not present

## 2018-05-17 DIAGNOSIS — M1990 Unspecified osteoarthritis, unspecified site: Secondary | ICD-10-CM | POA: Diagnosis not present

## 2018-05-24 DIAGNOSIS — Z1231 Encounter for screening mammogram for malignant neoplasm of breast: Secondary | ICD-10-CM | POA: Diagnosis not present

## 2018-05-29 DIAGNOSIS — R921 Mammographic calcification found on diagnostic imaging of breast: Secondary | ICD-10-CM | POA: Diagnosis not present

## 2018-06-03 ENCOUNTER — Ambulatory Visit: Payer: Medicare Other

## 2018-06-22 ENCOUNTER — Other Ambulatory Visit: Payer: Self-pay | Admitting: Podiatry

## 2018-06-22 ENCOUNTER — Ambulatory Visit (INDEPENDENT_AMBULATORY_CARE_PROVIDER_SITE_OTHER): Payer: Medicare Other

## 2018-06-22 ENCOUNTER — Encounter: Payer: Self-pay | Admitting: Podiatry

## 2018-06-22 ENCOUNTER — Ambulatory Visit (INDEPENDENT_AMBULATORY_CARE_PROVIDER_SITE_OTHER): Payer: Medicare Other | Admitting: Podiatry

## 2018-06-22 DIAGNOSIS — M76821 Posterior tibial tendinitis, right leg: Secondary | ICD-10-CM

## 2018-06-22 DIAGNOSIS — M76822 Posterior tibial tendinitis, left leg: Secondary | ICD-10-CM | POA: Diagnosis not present

## 2018-06-22 DIAGNOSIS — M79672 Pain in left foot: Secondary | ICD-10-CM

## 2018-06-22 DIAGNOSIS — G8929 Other chronic pain: Secondary | ICD-10-CM | POA: Diagnosis not present

## 2018-06-22 DIAGNOSIS — M19071 Primary osteoarthritis, right ankle and foot: Secondary | ICD-10-CM | POA: Diagnosis not present

## 2018-06-22 DIAGNOSIS — M2142 Flat foot [pes planus] (acquired), left foot: Principal | ICD-10-CM

## 2018-06-22 DIAGNOSIS — M2140 Flat foot [pes planus] (acquired), unspecified foot: Secondary | ICD-10-CM

## 2018-06-22 DIAGNOSIS — M2141 Flat foot [pes planus] (acquired), right foot: Secondary | ICD-10-CM | POA: Diagnosis not present

## 2018-06-22 DIAGNOSIS — M79671 Pain in right foot: Secondary | ICD-10-CM

## 2018-06-22 DIAGNOSIS — M19072 Primary osteoarthritis, left ankle and foot: Secondary | ICD-10-CM

## 2018-06-22 DIAGNOSIS — M79673 Pain in unspecified foot: Secondary | ICD-10-CM | POA: Diagnosis not present

## 2018-06-22 NOTE — Progress Notes (Signed)
Subjective:    Patient ID: [name], female    DOB: [birth year], 67 y.o.   MRN: [id number]  HPI 67 year old female presents the office today for concerns of pain to both of her feet with the left side worse than the right.  She states that she has had ongoing symptoms for several years but over the last year her pain is worsened.  She did try an ankle brace on her left side which did seem to help some but she had difficulty wearing a shoe.  She also tried some topical ointments without any significant relief.  She has a remote history of surgery on her right foot and she thinks this was to help correct her flatfoot and may have helped some did not give her the improvement she wanted.  She states that she gets quite a bit of pain to her left ankle and foot.  She states that she is seeing other doctors for this and they wanted to surgery but she wants to try nonsurgical options as she does not get great results with the right foot previously.  She is hesitant to have any further surgery.   Review of Systems  All other systems reviewed and are negative.  Past Medical History:  Diagnosis Date  . Anxiety   . Arthritis   . Bruises easily   . Cataracts, bilateral    immature  . Chronic back pain    DDD  . Depression    takes Cymbalta daily  . Dry skin   . Family history of adverse reaction to anesthesia    mom wakes up fighting and sick  . Fibromyalgia   . GERD (gastroesophageal reflux disease)    occasionally but will take Tums if needed  . Glaucoma   . History of bronchitis early Aug 2016  . History of shingles   . IBS (irritable bowel syndrome)   . Insomnia   . Joint pain   . Joint swelling   . Osteoarthritis   . Osteopenia   . Panic attacks   . PONV (postoperative nausea and vomiting)   . Urinary frequency   . Urinary urgency   . Vertigo   . Weakness    in hands    Past Surgical History:  Procedure Laterality Date  . ABDOMINAL HYSTERECTOMY  2007   with bladder  sling  . COLONOSCOPY    . ESOPHAGOGASTRODUODENOSCOPY    . FOOT SURGERY Right   . LEG SURGERY Right   . TOTAL KNEE ARTHROPLASTY Right 05/08/2015  . TOTAL KNEE ARTHROPLASTY Right 05/08/2015   Procedure: TOTAL KNEE ARTHROPLASTY;  Surgeon: Garald Balding, MD;  Location: [facility];  Service: Orthopedics;  Laterality: Right;     Current Outpatient Medications:  .  b complex vitamins tablet, Take 1 tablet by mouth daily., Disp: , Rfl:  .  Calcium-Magnesium-Vitamin D (CALCIUM MAGNESIUM PO), Take 1 tablet by mouth daily., Disp: , Rfl:  .  calcium-vitamin D (OSCAL WITH D) 500-200 MG-UNIT per tablet, Take 1 tablet by mouth daily. , Disp: , Rfl:  .  cholecalciferol (VITAMIN D) 1000 UNITS tablet, Take 1,000 Units by mouth daily., Disp: , Rfl:  .  EPINEPHrine (EPI-PEN) 0.3 mg/0.3 mL DEVI, Inject 0.3 mg into the muscle once.  , Disp: , Rfl:  .  Multiple Vitamins-Minerals (MULTIVITAMINS THER. W/MINERALS) TABS, Take 1 tablet by mouth daily.  , Disp: , Rfl:  .  timolol (TIMOPTIC) 0.5 % ophthalmic solution, PLACE 1 GTT INTO OU BID, Disp: ,  Rfl: 10 .  TIMOLOL MALEATE OP, Apply to eye., Disp: , Rfl:  .  TRAVATAN Z 0.004 % SOLN ophthalmic solution, INT 1 GTT IN OU HS, Disp: , Rfl: 2 .  vitamin C (ASCORBIC ACID) 500 MG tablet, Take 2,000 mg by mouth daily. , Disp: , Rfl:   Allergies  Allergen Reactions  . Ace Inhibitors Other (See Comments)    unknown  . Aripiprazole Other (See Comments)    Adverse reaction  . Cefixime Other (See Comments)    nervous  . Codeine Other (See Comments)    Wires her up  . Effexor [Venlafaxine Hydrochloride] Other (See Comments)    Headache-body ache  . Escitalopram Oxalate Other (See Comments)    Adverse reaction  . Geodon [Ziprasidone Hydrochloride] Other (See Comments)    Psychotic symptoms  . Lamictal [Lamotrigine] Other (See Comments)    Adverse reaction  . Lithium Other (See Comments)    Adverse reaction  . Nortriptyline Other (See Comments)    Adverse reaction    . Prozac [Fluoxetine Hcl] Other (See Comments)    Nightmares- panic attacks  . Sertraline Hcl Other (See Comments)    Adverse reaction  . Erythromycin Rash  . Latex Itching and Rash  . Penicillins Rash        Objective:   Physical Exam General: AAO x3, NAD  Dermatological: Skin is warm, dry and supple bilateral. Nails x 10 are well manicured; remaining integument appears unremarkable at this time. There are no open sores, no preulcerative lesions, no rash or signs of infection present.  Vascular: Dorsalis Pedis artery and Posterior Tibial artery pedal pulses are 2/4 bilateral with immedate capillary fill time. Pedal hair growth present. No varicosities and no lower extremity edema present bilateral. There is no pain with calf compression, swelling, warmth, erythema.   Neruologic: Grossly intact via light touch bilateral.  Protective threshold with Semmes Wienstein monofilament intact to all pedal sites bilateral.  Musculoskeletal: Significant flatfoot deformities present bilaterally.  There is subjective tenderness in the course the posterior tibial tendon posterior inferior to the medial malleolus on the left side.  Tenderness along sinus tarsi in the left foot.  Significant bony deformities present the bilateral first MPJs with arthritic changes present.  There is no area of pinpoint tenderness identified today.  Muscular strength 5/5 in all groups tested bilateral.  Gait: Unassisted, Nonantalgic.      Assessment & Plan:  67 year old female with significant flatfoot deformity, arthritis -Treatment options discussed including all alternatives, risks, and complications -Etiology of symptoms were discussed -X-rays were obtained and reviewed with the patient.  Significant flatfoot deformity is present.  Arthritic changes present in the midfoot as well as the subtalar joint.  Left side is worse than the right.  Screw fixation on the calcaneus on the right side from prior surgery. -After  long discussion regarding treatment options she wished to hold off on any surgery.  We discussed conservative options.  An ankle brace was helpful but she cannot wear shoe.  I had agreed to evaluate her today as well and after we evaluated her we will try for a Mezzo brace bilaterally.  Once we get this then we will work on a custom shoe. -She is scheduled to see [facility] tomorrow.   Trula Slade DPM

## 2018-06-23 ENCOUNTER — Ambulatory Visit: Payer: Medicare Other | Admitting: Orthotics

## 2018-06-23 DIAGNOSIS — M19071 Primary osteoarthritis, right ankle and foot: Secondary | ICD-10-CM

## 2018-06-23 DIAGNOSIS — M19072 Primary osteoarthritis, left ankle and foot: Principal | ICD-10-CM

## 2018-06-23 DIAGNOSIS — G8929 Other chronic pain: Secondary | ICD-10-CM

## 2018-06-23 DIAGNOSIS — M76821 Posterior tibial tendinitis, right leg: Secondary | ICD-10-CM

## 2018-06-23 DIAGNOSIS — M76822 Posterior tibial tendinitis, left leg: Secondary | ICD-10-CM

## 2018-06-23 DIAGNOSIS — M79673 Pain in unspecified foot: Secondary | ICD-10-CM

## 2018-06-24 NOTE — Progress Notes (Signed)
[name] for b/l mezzo braces; plan is to provide these and in 2020 to provide ROCKER sole custom shoes [name] over the braces.

## 2018-06-30 DIAGNOSIS — H04123 Dry eye syndrome of bilateral lacrimal glands: Secondary | ICD-10-CM | POA: Diagnosis not present

## 2018-06-30 DIAGNOSIS — H40013 Open angle with borderline findings, low risk, bilateral: Secondary | ICD-10-CM | POA: Diagnosis not present

## 2018-06-30 DIAGNOSIS — H02203 Unspecified lagophthalmos right eye, unspecified eyelid: Secondary | ICD-10-CM | POA: Diagnosis not present

## 2018-06-30 DIAGNOSIS — H02206 Unspecified lagophthalmos left eye, unspecified eyelid: Secondary | ICD-10-CM | POA: Diagnosis not present

## 2018-06-30 DIAGNOSIS — H02052 Trichiasis without entropian right lower eyelid: Secondary | ICD-10-CM | POA: Diagnosis not present

## 2018-06-30 DIAGNOSIS — H2513 Age-related nuclear cataract, bilateral: Secondary | ICD-10-CM | POA: Diagnosis not present

## 2018-07-07 DIAGNOSIS — L570 Actinic keratosis: Secondary | ICD-10-CM | POA: Diagnosis not present

## 2018-07-07 DIAGNOSIS — D044 Carcinoma in situ of skin of scalp and neck: Secondary | ICD-10-CM | POA: Diagnosis not present

## 2018-07-07 DIAGNOSIS — L821 Other seborrheic keratosis: Secondary | ICD-10-CM | POA: Diagnosis not present

## 2018-07-21 DIAGNOSIS — M7581 Other shoulder lesions, right shoulder: Secondary | ICD-10-CM | POA: Diagnosis not present

## 2018-07-21 DIAGNOSIS — E669 Obesity, unspecified: Secondary | ICD-10-CM | POA: Diagnosis not present

## 2018-07-21 DIAGNOSIS — M25532 Pain in left wrist: Secondary | ICD-10-CM | POA: Diagnosis not present

## 2018-07-21 DIAGNOSIS — M797 Fibromyalgia: Secondary | ICD-10-CM | POA: Diagnosis not present

## 2018-07-21 DIAGNOSIS — M25439 Effusion, unspecified wrist: Secondary | ICD-10-CM | POA: Diagnosis not present

## 2018-07-21 DIAGNOSIS — R700 Elevated erythrocyte sedimentation rate: Secondary | ICD-10-CM | POA: Diagnosis not present

## 2018-07-21 DIAGNOSIS — M79643 Pain in unspecified hand: Secondary | ICD-10-CM | POA: Diagnosis not present

## 2018-07-21 DIAGNOSIS — M2550 Pain in unspecified joint: Secondary | ICD-10-CM | POA: Diagnosis not present

## 2018-07-21 DIAGNOSIS — M1990 Unspecified osteoarthritis, unspecified site: Secondary | ICD-10-CM | POA: Diagnosis not present

## 2018-07-22 DIAGNOSIS — Z1211 Encounter for screening for malignant neoplasm of colon: Secondary | ICD-10-CM | POA: Diagnosis not present

## 2018-07-22 DIAGNOSIS — Z1212 Encounter for screening for malignant neoplasm of rectum: Secondary | ICD-10-CM | POA: Diagnosis not present

## 2018-07-23 DIAGNOSIS — C4442 Squamous cell carcinoma of skin of scalp and neck: Secondary | ICD-10-CM | POA: Diagnosis not present

## 2018-07-23 DIAGNOSIS — C801 Malignant (primary) neoplasm, unspecified: Secondary | ICD-10-CM | POA: Diagnosis not present

## 2018-07-23 DIAGNOSIS — N390 Urinary tract infection, site not specified: Secondary | ICD-10-CM | POA: Diagnosis not present

## 2018-07-27 ENCOUNTER — Ambulatory Visit: Payer: Medicare Other | Admitting: Orthotics

## 2018-07-27 DIAGNOSIS — M76821 Posterior tibial tendinitis, right leg: Secondary | ICD-10-CM

## 2018-07-27 DIAGNOSIS — M79673 Pain in unspecified foot: Secondary | ICD-10-CM

## 2018-07-27 DIAGNOSIS — N390 Urinary tract infection, site not specified: Secondary | ICD-10-CM | POA: Diagnosis not present

## 2018-07-27 DIAGNOSIS — N3010 Interstitial cystitis (chronic) without hematuria: Secondary | ICD-10-CM | POA: Diagnosis not present

## 2018-07-27 DIAGNOSIS — C801 Malignant (primary) neoplasm, unspecified: Secondary | ICD-10-CM | POA: Diagnosis not present

## 2018-07-27 DIAGNOSIS — M19072 Primary osteoarthritis, left ankle and foot: Principal | ICD-10-CM

## 2018-07-27 DIAGNOSIS — N3281 Overactive bladder: Secondary | ICD-10-CM | POA: Diagnosis not present

## 2018-07-27 DIAGNOSIS — G8929 Other chronic pain: Secondary | ICD-10-CM

## 2018-07-27 DIAGNOSIS — M19071 Primary osteoarthritis, right ankle and foot: Secondary | ICD-10-CM

## 2018-07-27 DIAGNOSIS — M76822 Posterior tibial tendinitis, left leg: Secondary | ICD-10-CM

## 2018-07-27 NOTE — Progress Notes (Signed)
LEFT mezzo brace needs to be returned to have an cutout/offload for bony protuberance (medial cunfrm)  Offload and pad.

## 2018-07-28 LAB — COLOGUARD: Cologuard: NEGATIVE

## 2018-08-27 ENCOUNTER — Encounter: Payer: Self-pay | Admitting: Podiatry

## 2018-08-27 ENCOUNTER — Other Ambulatory Visit: Payer: Medicare Other | Admitting: Orthotics

## 2018-08-27 ENCOUNTER — Ambulatory Visit (INDEPENDENT_AMBULATORY_CARE_PROVIDER_SITE_OTHER): Payer: Medicare Other | Admitting: Podiatry

## 2018-08-27 DIAGNOSIS — M76822 Posterior tibial tendinitis, left leg: Secondary | ICD-10-CM | POA: Diagnosis not present

## 2018-08-27 DIAGNOSIS — M76821 Posterior tibial tendinitis, right leg: Secondary | ICD-10-CM | POA: Diagnosis not present

## 2018-08-27 DIAGNOSIS — M19071 Primary osteoarthritis, right ankle and foot: Secondary | ICD-10-CM

## 2018-08-27 DIAGNOSIS — G8929 Other chronic pain: Secondary | ICD-10-CM | POA: Diagnosis not present

## 2018-08-27 DIAGNOSIS — M2022 Hallux rigidus, left foot: Secondary | ICD-10-CM

## 2018-08-27 DIAGNOSIS — M19072 Primary osteoarthritis, left ankle and foot: Secondary | ICD-10-CM

## 2018-08-27 DIAGNOSIS — M7742 Metatarsalgia, left foot: Secondary | ICD-10-CM

## 2018-08-27 DIAGNOSIS — M7741 Metatarsalgia, right foot: Secondary | ICD-10-CM

## 2018-08-27 DIAGNOSIS — M79673 Pain in unspecified foot: Secondary | ICD-10-CM | POA: Diagnosis not present

## 2018-08-27 DIAGNOSIS — M2021 Hallux rigidus, right foot: Secondary | ICD-10-CM

## 2018-08-27 DIAGNOSIS — M21619 Bunion of unspecified foot: Secondary | ICD-10-CM

## 2018-09-01 NOTE — Progress Notes (Signed)
Subjective: 68 year old female presents the office today for concerns of progressing pain to both of her feet.  She states she is becoming frustrated with the pain that she is having and she feels that it has been progressing.  This been a chronic issue for her but has been getting worse.  She denies any recent injury or trauma and denies any increase in swelling or redness to her feet.  I last saw her we discussed orthotics or a brace. Rick evaluate her and she was measured for a mezzo brace.  She came back to pick it up and it was uncomfortable so sent back modifications.  She is not sure how this will be helpful. Denies any systemic complaints such as fevers, chills, nausea, vomiting. No acute changes since last appointment, and no other complaints at this time.   Objective: AAO x3, NAD DP/PT pulses palpable bilaterally, CRT less than 3 seconds Flatfoot is present to the decreased range of motion of the subtalar joint.  There is significant bunion deformity present with decreased range of motion and arthritic changes are palpable.  She is getting discomfort going over to the other lesser metatarsal phalangeal joints.  There is prominence of metatarsal heads plantarly with atrophy of the fat pad.  No pain with calf compression, swelling, warmth, erythema  Assessment: Severe bunion deformities with arthritic changes, flatfoot.  Plan: -All treatment options discussed with the patient including all alternatives, risks, complications.  -Today we had a long discussion regards her treatment options at this point have any surgery intervention.  In order to help prevent her progressiveness of this we did have good support.  This is where we were hoping with control in the rear foot would help progression.  However she is getting more pain to the forefoot.  I think she may benefit more from an orthotic.  I discussed today with Betha and she also evaluated the patient.  We will work on getting her new orthotic,  brace to see if this will be helpful.  Time discussed with her is very difficult situation with her foot type.  We can also consider custom shoes potentially. -Patient encouraged to call the office with any questions, concerns, change in symptoms.   Trula Slade DPM

## 2018-09-24 ENCOUNTER — Other Ambulatory Visit: Payer: Self-pay

## 2018-09-24 ENCOUNTER — Ambulatory Visit (INDEPENDENT_AMBULATORY_CARE_PROVIDER_SITE_OTHER): Payer: Medicare Other | Admitting: Orthotics

## 2018-09-24 DIAGNOSIS — M19072 Primary osteoarthritis, left ankle and foot: Secondary | ICD-10-CM

## 2018-09-24 DIAGNOSIS — M76821 Posterior tibial tendinitis, right leg: Secondary | ICD-10-CM

## 2018-09-24 DIAGNOSIS — M7742 Metatarsalgia, left foot: Secondary | ICD-10-CM

## 2018-09-24 DIAGNOSIS — M7741 Metatarsalgia, right foot: Secondary | ICD-10-CM

## 2018-09-24 DIAGNOSIS — M19071 Primary osteoarthritis, right ankle and foot: Secondary | ICD-10-CM

## 2018-09-24 DIAGNOSIS — M76822 Posterior tibial tendinitis, left leg: Secondary | ICD-10-CM

## 2018-09-24 NOTE — Progress Notes (Signed)
Picked up f/o; also adjusted mezzo brace (LEFT) by thining out navicular area.  She was pleased with result and is going to make a return appointment for me to do the other side; also needs to make an adjustment to the arch

## 2018-10-01 ENCOUNTER — Ambulatory Visit (INDEPENDENT_AMBULATORY_CARE_PROVIDER_SITE_OTHER): Payer: Medicare Other | Admitting: Orthotics

## 2018-10-01 ENCOUNTER — Other Ambulatory Visit: Payer: Self-pay

## 2018-10-01 DIAGNOSIS — M76822 Posterior tibial tendinitis, left leg: Secondary | ICD-10-CM

## 2018-10-01 DIAGNOSIS — M7741 Metatarsalgia, right foot: Secondary | ICD-10-CM

## 2018-10-01 DIAGNOSIS — M76821 Posterior tibial tendinitis, right leg: Secondary | ICD-10-CM

## 2018-10-01 DIAGNOSIS — M19071 Primary osteoarthritis, right ankle and foot: Secondary | ICD-10-CM

## 2018-10-01 DIAGNOSIS — M7742 Metatarsalgia, left foot: Secondary | ICD-10-CM

## 2018-10-01 DIAGNOSIS — M19072 Primary osteoarthritis, left ankle and foot: Secondary | ICD-10-CM | POA: Diagnosis not present

## 2018-10-01 NOTE — Progress Notes (Signed)
Patient came in today for delivery of b/l mezzo braces; significant adjustments had to be be made to offload navicular left. PPT padding was added and shell was thinned out at point of concern.   Patient ended up well pleased with final product and attention given.

## 2018-10-11 ENCOUNTER — Telehealth: Payer: Self-pay | Admitting: Podiatry

## 2018-10-11 NOTE — Telephone Encounter (Signed)
Pt left message for Liliane Channel on 3.26.2020 asking for a call regarding the braces she received.

## 2018-10-11 NOTE — Telephone Encounter (Signed)
Tried calling her back...got vm..left message for her to call.

## 2018-10-18 ENCOUNTER — Other Ambulatory Visit: Payer: Medicare Other | Admitting: Orthotics

## 2018-11-08 ENCOUNTER — Other Ambulatory Visit: Payer: Self-pay

## 2018-11-08 ENCOUNTER — Ambulatory Visit: Payer: Medicare Other | Admitting: Orthotics

## 2018-11-08 DIAGNOSIS — M19071 Primary osteoarthritis, right ankle and foot: Secondary | ICD-10-CM

## 2018-11-08 DIAGNOSIS — M19072 Primary osteoarthritis, left ankle and foot: Secondary | ICD-10-CM

## 2018-11-08 DIAGNOSIS — M7741 Metatarsalgia, right foot: Secondary | ICD-10-CM

## 2018-11-08 DIAGNOSIS — M76822 Posterior tibial tendinitis, left leg: Secondary | ICD-10-CM

## 2018-11-08 DIAGNOSIS — M76821 Posterior tibial tendinitis, right leg: Secondary | ICD-10-CM

## 2018-11-08 DIAGNOSIS — M7742 Metatarsalgia, left foot: Secondary | ICD-10-CM

## 2018-11-08 NOTE — Progress Notes (Signed)
Adjusted mezzo brace ( LEFT) by removing material and adding padding to navicular/1st met base area; adjust f/o by removing material from the same area.

## 2018-11-15 ENCOUNTER — Telehealth: Payer: Self-pay | Admitting: Podiatry

## 2018-11-15 NOTE — Telephone Encounter (Signed)
Dr. Jacqualyn Posey, pt has an appointment with Liliane Channel on 14 May. She is wanting you to go over her foot conditions with Liliane Channel before she comes in. States she has been having trouble with the braces and has been having to come in a bit to keep getting them re-adjusted.

## 2018-11-24 NOTE — Telephone Encounter (Signed)
[name], lets talk about this tomorrow morning before she comes in. Thanks.

## 2018-11-25 ENCOUNTER — Other Ambulatory Visit: Payer: Self-pay

## 2018-11-25 ENCOUNTER — Ambulatory Visit: Payer: Medicare Other | Admitting: Orthotics

## 2018-11-25 DIAGNOSIS — M7741 Metatarsalgia, right foot: Secondary | ICD-10-CM

## 2018-11-25 DIAGNOSIS — M76822 Posterior tibial tendinitis, left leg: Secondary | ICD-10-CM

## 2018-11-25 DIAGNOSIS — M76821 Posterior tibial tendinitis, right leg: Secondary | ICD-10-CM

## 2018-11-25 NOTE — Progress Notes (Signed)
Spent time with Media going over shoe selection; issue is anything touching/putting pressure on foot deformity, boney prominence navicular.   We discussed at length possile outcomes; I did add about 6* of varus post on mezzo brace and gave her moore balance shoe to see if that would better accomodate.

## 2018-11-26 DIAGNOSIS — N3281 Overactive bladder: Secondary | ICD-10-CM | POA: Diagnosis not present

## 2018-11-26 DIAGNOSIS — M7581 Other shoulder lesions, right shoulder: Secondary | ICD-10-CM | POA: Diagnosis not present

## 2018-11-26 DIAGNOSIS — E669 Obesity, unspecified: Secondary | ICD-10-CM | POA: Diagnosis not present

## 2018-11-26 DIAGNOSIS — M797 Fibromyalgia: Secondary | ICD-10-CM | POA: Diagnosis not present

## 2018-11-26 DIAGNOSIS — M79643 Pain in unspecified hand: Secondary | ICD-10-CM | POA: Diagnosis not present

## 2018-11-26 DIAGNOSIS — M2550 Pain in unspecified joint: Secondary | ICD-10-CM | POA: Diagnosis not present

## 2018-11-26 DIAGNOSIS — Z7189 Other specified counseling: Secondary | ICD-10-CM | POA: Diagnosis not present

## 2018-11-26 DIAGNOSIS — R928 Other abnormal and inconclusive findings on diagnostic imaging of breast: Secondary | ICD-10-CM | POA: Diagnosis not present

## 2018-11-26 DIAGNOSIS — M1990 Unspecified osteoarthritis, unspecified site: Secondary | ICD-10-CM | POA: Diagnosis not present

## 2018-11-26 DIAGNOSIS — M25532 Pain in left wrist: Secondary | ICD-10-CM | POA: Diagnosis not present

## 2018-11-26 DIAGNOSIS — N3010 Interstitial cystitis (chronic) without hematuria: Secondary | ICD-10-CM | POA: Diagnosis not present

## 2018-11-26 DIAGNOSIS — N390 Urinary tract infection, site not specified: Secondary | ICD-10-CM | POA: Diagnosis not present

## 2018-11-30 ENCOUNTER — Encounter: Payer: Self-pay | Admitting: Podiatry

## 2018-11-30 ENCOUNTER — Ambulatory Visit (INDEPENDENT_AMBULATORY_CARE_PROVIDER_SITE_OTHER): Payer: Medicare Other | Admitting: Podiatry

## 2018-11-30 ENCOUNTER — Other Ambulatory Visit: Payer: Self-pay

## 2018-11-30 VITALS — Temp 98.8°F

## 2018-11-30 DIAGNOSIS — M19072 Primary osteoarthritis, left ankle and foot: Secondary | ICD-10-CM | POA: Diagnosis not present

## 2018-11-30 DIAGNOSIS — M19071 Primary osteoarthritis, right ankle and foot: Secondary | ICD-10-CM

## 2018-11-30 DIAGNOSIS — M76822 Posterior tibial tendinitis, left leg: Secondary | ICD-10-CM

## 2018-11-30 DIAGNOSIS — M79673 Pain in unspecified foot: Secondary | ICD-10-CM

## 2018-11-30 DIAGNOSIS — M76821 Posterior tibial tendinitis, right leg: Secondary | ICD-10-CM | POA: Diagnosis not present

## 2018-11-30 DIAGNOSIS — G8929 Other chronic pain: Secondary | ICD-10-CM

## 2018-12-01 DIAGNOSIS — M2550 Pain in unspecified joint: Secondary | ICD-10-CM | POA: Diagnosis not present

## 2018-12-01 DIAGNOSIS — Z79899 Other long term (current) drug therapy: Secondary | ICD-10-CM | POA: Diagnosis not present

## 2018-12-09 NOTE — Progress Notes (Signed)
Subjective: 67 year old female presents the office today for evaluation of bilateral foot pain.  After multiple adjustments she feels that the Mezzo brace has been somewhat helpful for couple hours but she still getting discomfort in the digits.  She feels that the shoes are causing issues.  I also discussed the case with Liliane Channel and he feels that there is been no difficulty with the brace, shoe interface. Denies any systemic complaints such as fevers, chills, nausea, vomiting. No acute changes since last appointment, and no other complaints at this time.   She has previously tried a MBT shoe and that was somewhat helpful.   Objective: AAO x3, NAD DP/PT pulses palpable bilaterally, CRT less than 3 seconds Overall exam is grossly unchanged.  Significant flatfoot deformity present the left side worse than the right.  She is previous had surgery on the right foot.  There is decreased range of motion of the subtalar joint.  Significant bunion deformities present as well as multiple digital contractures are present. No open lesions or pre-ulcerative lesions.  No pain with calf compression, swelling, warmth, erythema  Assessment: 68 year old female with chronic bilateral foot pain  Plan: -All treatment options discussed with the patient including all alternatives, risks, complications.  -I discussed the case with the patient as well as Liliane Channel and we discussed either a custom type shoe with a rocker-bottom shoe.  I will have her follow back up with [facility] for this.  We will check with insurance to see if we get coverage.  Also discussed toe separators of the toe discussed this with him to gather. -Patient encouraged to call the office with any questions, concerns, change in symptoms.   Trula Slade DPM

## 2019-01-05 DIAGNOSIS — H2513 Age-related nuclear cataract, bilateral: Secondary | ICD-10-CM | POA: Diagnosis not present

## 2019-01-05 DIAGNOSIS — H40013 Open angle with borderline findings, low risk, bilateral: Secondary | ICD-10-CM | POA: Diagnosis not present

## 2019-01-05 DIAGNOSIS — H04123 Dry eye syndrome of bilateral lacrimal glands: Secondary | ICD-10-CM | POA: Diagnosis not present

## 2019-01-05 DIAGNOSIS — H02206 Unspecified lagophthalmos left eye, unspecified eyelid: Secondary | ICD-10-CM | POA: Diagnosis not present

## 2019-01-05 DIAGNOSIS — H02203 Unspecified lagophthalmos right eye, unspecified eyelid: Secondary | ICD-10-CM | POA: Diagnosis not present

## 2019-01-26 DIAGNOSIS — R928 Other abnormal and inconclusive findings on diagnostic imaging of breast: Secondary | ICD-10-CM | POA: Diagnosis not present

## 2019-01-26 DIAGNOSIS — N3281 Overactive bladder: Secondary | ICD-10-CM | POA: Diagnosis not present

## 2019-01-26 DIAGNOSIS — F39 Unspecified mood [affective] disorder: Secondary | ICD-10-CM | POA: Diagnosis not present

## 2019-01-26 DIAGNOSIS — N390 Urinary tract infection, site not specified: Secondary | ICD-10-CM | POA: Diagnosis not present

## 2019-02-16 DIAGNOSIS — H401211 Low-tension glaucoma, right eye, mild stage: Secondary | ICD-10-CM | POA: Diagnosis not present

## 2019-02-16 DIAGNOSIS — N3281 Overactive bladder: Secondary | ICD-10-CM | POA: Diagnosis not present

## 2019-02-16 DIAGNOSIS — H401222 Low-tension glaucoma, left eye, moderate stage: Secondary | ICD-10-CM | POA: Diagnosis not present

## 2019-02-16 DIAGNOSIS — N390 Urinary tract infection, site not specified: Secondary | ICD-10-CM | POA: Diagnosis not present

## 2019-02-16 DIAGNOSIS — F4310 Post-traumatic stress disorder, unspecified: Secondary | ICD-10-CM | POA: Diagnosis not present

## 2019-02-16 DIAGNOSIS — H16223 Keratoconjunctivitis sicca, not specified as Sjogren's, bilateral: Secondary | ICD-10-CM | POA: Diagnosis not present

## 2019-02-16 DIAGNOSIS — H02203 Unspecified lagophthalmos right eye, unspecified eyelid: Secondary | ICD-10-CM | POA: Diagnosis not present

## 2019-02-16 DIAGNOSIS — R928 Other abnormal and inconclusive findings on diagnostic imaging of breast: Secondary | ICD-10-CM | POA: Diagnosis not present

## 2019-02-16 DIAGNOSIS — H2513 Age-related nuclear cataract, bilateral: Secondary | ICD-10-CM | POA: Diagnosis not present

## 2019-02-16 DIAGNOSIS — H02206 Unspecified lagophthalmos left eye, unspecified eyelid: Secondary | ICD-10-CM | POA: Diagnosis not present

## 2019-03-07 DIAGNOSIS — Z23 Encounter for immunization: Secondary | ICD-10-CM | POA: Diagnosis not present

## 2019-03-07 DIAGNOSIS — R928 Other abnormal and inconclusive findings on diagnostic imaging of breast: Secondary | ICD-10-CM | POA: Diagnosis not present

## 2019-03-07 DIAGNOSIS — N3281 Overactive bladder: Secondary | ICD-10-CM | POA: Diagnosis not present

## 2019-03-07 DIAGNOSIS — N390 Urinary tract infection, site not specified: Secondary | ICD-10-CM | POA: Diagnosis not present

## 2019-03-08 DIAGNOSIS — T1511XA Foreign body in conjunctival sac, right eye, initial encounter: Secondary | ICD-10-CM | POA: Diagnosis not present

## 2019-05-30 DIAGNOSIS — Z8262 Family history of osteoporosis: Secondary | ICD-10-CM | POA: Diagnosis not present

## 2019-05-30 DIAGNOSIS — R29890 Loss of height: Secondary | ICD-10-CM | POA: Diagnosis not present

## 2019-05-30 DIAGNOSIS — M8589 Other specified disorders of bone density and structure, multiple sites: Secondary | ICD-10-CM | POA: Diagnosis not present

## 2019-05-30 DIAGNOSIS — R921 Mammographic calcification found on diagnostic imaging of breast: Secondary | ICD-10-CM | POA: Diagnosis not present

## 2019-05-30 DIAGNOSIS — Z96651 Presence of right artificial knee joint: Secondary | ICD-10-CM | POA: Diagnosis not present

## 2019-06-20 DIAGNOSIS — H401222 Low-tension glaucoma, left eye, moderate stage: Secondary | ICD-10-CM | POA: Diagnosis not present

## 2019-06-20 DIAGNOSIS — H401211 Low-tension glaucoma, right eye, mild stage: Secondary | ICD-10-CM | POA: Diagnosis not present

## 2019-06-20 DIAGNOSIS — H2513 Age-related nuclear cataract, bilateral: Secondary | ICD-10-CM | POA: Diagnosis not present

## 2019-06-20 DIAGNOSIS — H02203 Unspecified lagophthalmos right eye, unspecified eyelid: Secondary | ICD-10-CM | POA: Diagnosis not present

## 2019-06-20 DIAGNOSIS — H16223 Keratoconjunctivitis sicca, not specified as Sjogren's, bilateral: Secondary | ICD-10-CM | POA: Diagnosis not present

## 2019-06-20 DIAGNOSIS — H02206 Unspecified lagophthalmos left eye, unspecified eyelid: Secondary | ICD-10-CM | POA: Diagnosis not present

## 2019-08-28 ENCOUNTER — Ambulatory Visit: Payer: Medicare Other | Attending: Internal Medicine

## 2019-08-28 DIAGNOSIS — Z23 Encounter for immunization: Secondary | ICD-10-CM | POA: Insufficient documentation

## 2019-08-28 NOTE — Progress Notes (Signed)
   Covid-19 Vaccination Clinic  Name:  [name]    MRN: [id number] DOB: 1950-07-22  08/28/2019  [name] was observed post Covid-19 immunization for 15 minutes without incidence. She was provided with Vaccine Information Sheet and instruction to access the V-Safe system.   [name] was instructed to call 911 with any severe reactions post vaccine: Marland Kitchen Difficulty breathing  . Swelling of your face and throat  . A fast heartbeat  . A bad rash all over your body  . Dizziness and weakness    Immunizations Administered    Name Date Dose VIS Date Route   Pfizer COVID-19 Vaccine 08/28/2019 11:19 AM 0.3 mL 06/24/2019 Intramuscular   Manufacturer: [employer]   Lot: [id number]   [place]: [id number]

## 2019-09-20 ENCOUNTER — Ambulatory Visit: Payer: Medicare Other | Attending: Internal Medicine

## 2019-09-20 DIAGNOSIS — Z23 Encounter for immunization: Secondary | ICD-10-CM | POA: Insufficient documentation

## 2019-09-20 NOTE — Progress Notes (Signed)
   Covid-19 Vaccination Clinic  Name:  [name]    MRN: [id number] DOB: 05-Dec-1950  09/20/2019  [name] was observed post Covid-19 immunization for 30 minutes based on pre-vaccination screening without incident. She was provided with Vaccine Information Sheet and instruction to access the V-Safe system.   [name] was instructed to call 911 with any severe reactions post vaccine: Marland Kitchen Difficulty breathing  . Swelling of face and throat  . A fast heartbeat  . A bad rash all over body  . Dizziness and weakness   Immunizations Administered    Name Date Dose VIS Date Route   Pfizer COVID-19 Vaccine 09/20/2019  2:19 PM 0.3 mL 06/24/2019 Intramuscular   Manufacturer: [employer]   Lot: [id number]   [place]: [id number]

## 2019-10-18 DIAGNOSIS — H401211 Low-tension glaucoma, right eye, mild stage: Secondary | ICD-10-CM | POA: Diagnosis not present

## 2019-10-18 DIAGNOSIS — H43811 Vitreous degeneration, right eye: Secondary | ICD-10-CM | POA: Diagnosis not present

## 2019-10-18 DIAGNOSIS — H02203 Unspecified lagophthalmos right eye, unspecified eyelid: Secondary | ICD-10-CM | POA: Diagnosis not present

## 2019-10-18 DIAGNOSIS — H02206 Unspecified lagophthalmos left eye, unspecified eyelid: Secondary | ICD-10-CM | POA: Diagnosis not present

## 2019-10-18 DIAGNOSIS — H16223 Keratoconjunctivitis sicca, not specified as Sjogren's, bilateral: Secondary | ICD-10-CM | POA: Diagnosis not present

## 2019-10-18 DIAGNOSIS — H401222 Low-tension glaucoma, left eye, moderate stage: Secondary | ICD-10-CM | POA: Diagnosis not present

## 2019-10-18 DIAGNOSIS — H2513 Age-related nuclear cataract, bilateral: Secondary | ICD-10-CM | POA: Diagnosis not present

## 2019-10-22 DIAGNOSIS — H401134 Primary open-angle glaucoma, bilateral, indeterminate stage: Secondary | ICD-10-CM | POA: Diagnosis not present

## 2019-10-22 DIAGNOSIS — H43811 Vitreous degeneration, right eye: Secondary | ICD-10-CM | POA: Diagnosis not present

## 2019-10-22 DIAGNOSIS — H04123 Dry eye syndrome of bilateral lacrimal glands: Secondary | ICD-10-CM | POA: Diagnosis not present

## 2019-10-22 DIAGNOSIS — H2513 Age-related nuclear cataract, bilateral: Secondary | ICD-10-CM | POA: Diagnosis not present

## 2019-11-07 DIAGNOSIS — N3281 Overactive bladder: Secondary | ICD-10-CM | POA: Diagnosis not present

## 2019-11-07 DIAGNOSIS — M79622 Pain in left upper arm: Secondary | ICD-10-CM | POA: Diagnosis not present

## 2019-11-07 DIAGNOSIS — R928 Other abnormal and inconclusive findings on diagnostic imaging of breast: Secondary | ICD-10-CM | POA: Diagnosis not present

## 2019-11-07 DIAGNOSIS — M797 Fibromyalgia: Secondary | ICD-10-CM | POA: Diagnosis not present

## 2019-11-29 DIAGNOSIS — H16223 Keratoconjunctivitis sicca, not specified as Sjogren's, bilateral: Secondary | ICD-10-CM | POA: Diagnosis not present

## 2019-11-29 DIAGNOSIS — H2513 Age-related nuclear cataract, bilateral: Secondary | ICD-10-CM | POA: Diagnosis not present

## 2019-11-29 DIAGNOSIS — H401222 Low-tension glaucoma, left eye, moderate stage: Secondary | ICD-10-CM | POA: Diagnosis not present

## 2019-11-29 DIAGNOSIS — H401211 Low-tension glaucoma, right eye, mild stage: Secondary | ICD-10-CM | POA: Diagnosis not present

## 2019-11-29 DIAGNOSIS — H43392 Other vitreous opacities, left eye: Secondary | ICD-10-CM | POA: Diagnosis not present

## 2019-11-29 DIAGNOSIS — H43811 Vitreous degeneration, right eye: Secondary | ICD-10-CM | POA: Diagnosis not present

## 2020-02-20 DIAGNOSIS — I10 Essential (primary) hypertension: Secondary | ICD-10-CM | POA: Diagnosis not present

## 2020-02-20 DIAGNOSIS — F39 Unspecified mood [affective] disorder: Secondary | ICD-10-CM | POA: Diagnosis not present

## 2020-02-20 DIAGNOSIS — K589 Irritable bowel syndrome without diarrhea: Secondary | ICD-10-CM | POA: Diagnosis not present

## 2020-02-20 DIAGNOSIS — M79622 Pain in left upper arm: Secondary | ICD-10-CM | POA: Diagnosis not present

## 2020-02-20 DIAGNOSIS — M8580 Other specified disorders of bone density and structure, unspecified site: Secondary | ICD-10-CM | POA: Diagnosis not present

## 2020-02-20 DIAGNOSIS — N3010 Interstitial cystitis (chronic) without hematuria: Secondary | ICD-10-CM | POA: Diagnosis not present

## 2020-02-20 DIAGNOSIS — E0965 Drug or chemical induced diabetes mellitus with hyperglycemia: Secondary | ICD-10-CM | POA: Diagnosis not present

## 2020-02-20 DIAGNOSIS — F411 Generalized anxiety disorder: Secondary | ICD-10-CM | POA: Diagnosis not present

## 2020-02-20 DIAGNOSIS — M797 Fibromyalgia: Secondary | ICD-10-CM | POA: Diagnosis not present

## 2020-02-20 DIAGNOSIS — R100 Acute abdomen: Secondary | ICD-10-CM | POA: Diagnosis not present

## 2020-02-20 DIAGNOSIS — N3281 Overactive bladder: Secondary | ICD-10-CM | POA: Diagnosis not present

## 2020-02-21 ENCOUNTER — Other Ambulatory Visit: Payer: Self-pay | Admitting: Family Medicine

## 2020-02-21 DIAGNOSIS — R10811 Right upper quadrant abdominal tenderness: Secondary | ICD-10-CM

## 2020-02-24 ENCOUNTER — Other Ambulatory Visit: Payer: Self-pay | Admitting: Family Medicine

## 2020-02-24 ENCOUNTER — Ambulatory Visit
Admission: RE | Admit: 2020-02-24 | Discharge: 2020-02-24 | Disposition: A | Discharge status: Home / Self Care | Payer: Medicare Other | Source: Ambulatory Visit | Attending: Family Medicine | Admitting: Family Medicine

## 2020-02-24 DIAGNOSIS — R109 Unspecified abdominal pain: Secondary | ICD-10-CM

## 2020-02-24 DIAGNOSIS — F411 Generalized anxiety disorder: Secondary | ICD-10-CM | POA: Diagnosis not present

## 2020-02-24 DIAGNOSIS — M47814 Spondylosis without myelopathy or radiculopathy, thoracic region: Secondary | ICD-10-CM | POA: Diagnosis not present

## 2020-02-24 DIAGNOSIS — M4186 Other forms of scoliosis, lumbar region: Secondary | ICD-10-CM | POA: Diagnosis not present

## 2020-02-24 DIAGNOSIS — J449 Chronic obstructive pulmonary disease, unspecified: Secondary | ICD-10-CM | POA: Diagnosis not present

## 2020-02-24 DIAGNOSIS — J984 Other disorders of lung: Secondary | ICD-10-CM | POA: Diagnosis not present

## 2020-02-24 DIAGNOSIS — N3281 Overactive bladder: Secondary | ICD-10-CM | POA: Diagnosis not present

## 2020-02-24 DIAGNOSIS — R100 Acute abdomen: Secondary | ICD-10-CM | POA: Diagnosis not present

## 2020-02-24 DIAGNOSIS — M79622 Pain in left upper arm: Secondary | ICD-10-CM | POA: Diagnosis not present

## 2020-02-28 ENCOUNTER — Other Ambulatory Visit: Payer: Medicare Other

## 2020-03-02 DIAGNOSIS — F411 Generalized anxiety disorder: Secondary | ICD-10-CM | POA: Diagnosis not present

## 2020-03-02 DIAGNOSIS — K5900 Constipation, unspecified: Secondary | ICD-10-CM | POA: Diagnosis not present

## 2020-03-02 DIAGNOSIS — N3281 Overactive bladder: Secondary | ICD-10-CM | POA: Diagnosis not present

## 2020-03-02 DIAGNOSIS — J45909 Unspecified asthma, uncomplicated: Secondary | ICD-10-CM | POA: Diagnosis not present

## 2020-03-28 DIAGNOSIS — Z23 Encounter for immunization: Secondary | ICD-10-CM | POA: Diagnosis not present

## 2020-03-28 DIAGNOSIS — N3281 Overactive bladder: Secondary | ICD-10-CM | POA: Diagnosis not present

## 2020-03-28 DIAGNOSIS — F4310 Post-traumatic stress disorder, unspecified: Secondary | ICD-10-CM | POA: Diagnosis not present

## 2020-03-28 DIAGNOSIS — J45909 Unspecified asthma, uncomplicated: Secondary | ICD-10-CM | POA: Diagnosis not present

## 2020-03-28 DIAGNOSIS — Z1152 Encounter for screening for COVID-19: Secondary | ICD-10-CM | POA: Diagnosis not present

## 2020-03-28 DIAGNOSIS — F411 Generalized anxiety disorder: Secondary | ICD-10-CM | POA: Diagnosis not present

## 2020-04-04 DIAGNOSIS — Z23 Encounter for immunization: Secondary | ICD-10-CM | POA: Diagnosis not present

## 2020-04-04 DIAGNOSIS — J45909 Unspecified asthma, uncomplicated: Secondary | ICD-10-CM | POA: Diagnosis not present

## 2020-04-04 DIAGNOSIS — Z1152 Encounter for screening for COVID-19: Secondary | ICD-10-CM | POA: Diagnosis not present

## 2020-04-04 DIAGNOSIS — F411 Generalized anxiety disorder: Secondary | ICD-10-CM | POA: Diagnosis not present

## 2020-04-04 DIAGNOSIS — N3281 Overactive bladder: Secondary | ICD-10-CM | POA: Diagnosis not present

## 2020-04-04 DIAGNOSIS — F4310 Post-traumatic stress disorder, unspecified: Secondary | ICD-10-CM | POA: Diagnosis not present

## 2020-04-05 DIAGNOSIS — Z23 Encounter for immunization: Secondary | ICD-10-CM | POA: Diagnosis not present

## 2020-04-05 DIAGNOSIS — N3281 Overactive bladder: Secondary | ICD-10-CM | POA: Diagnosis not present

## 2020-04-05 DIAGNOSIS — F411 Generalized anxiety disorder: Secondary | ICD-10-CM | POA: Diagnosis not present

## 2020-04-05 DIAGNOSIS — J45909 Unspecified asthma, uncomplicated: Secondary | ICD-10-CM | POA: Diagnosis not present

## 2020-04-05 DIAGNOSIS — Z1152 Encounter for screening for COVID-19: Secondary | ICD-10-CM | POA: Diagnosis not present

## 2020-04-11 ENCOUNTER — Encounter: Payer: Self-pay | Admitting: Gastroenterology

## 2020-05-05 DIAGNOSIS — Z23 Encounter for immunization: Secondary | ICD-10-CM | POA: Diagnosis not present

## 2020-05-14 DIAGNOSIS — H401211 Low-tension glaucoma, right eye, mild stage: Secondary | ICD-10-CM | POA: Diagnosis not present

## 2020-05-14 DIAGNOSIS — H43392 Other vitreous opacities, left eye: Secondary | ICD-10-CM | POA: Diagnosis not present

## 2020-05-14 DIAGNOSIS — H16223 Keratoconjunctivitis sicca, not specified as Sjogren's, bilateral: Secondary | ICD-10-CM | POA: Diagnosis not present

## 2020-05-14 DIAGNOSIS — H2513 Age-related nuclear cataract, bilateral: Secondary | ICD-10-CM | POA: Diagnosis not present

## 2020-05-14 DIAGNOSIS — H401222 Low-tension glaucoma, left eye, moderate stage: Secondary | ICD-10-CM | POA: Diagnosis not present

## 2020-05-14 DIAGNOSIS — H43811 Vitreous degeneration, right eye: Secondary | ICD-10-CM | POA: Diagnosis not present

## 2020-06-04 DIAGNOSIS — Z1231 Encounter for screening mammogram for malignant neoplasm of breast: Secondary | ICD-10-CM | POA: Diagnosis not present

## 2020-06-05 ENCOUNTER — Encounter: Payer: Self-pay | Admitting: Gastroenterology

## 2020-06-05 ENCOUNTER — Ambulatory Visit (INDEPENDENT_AMBULATORY_CARE_PROVIDER_SITE_OTHER): Payer: Medicare Other | Admitting: Gastroenterology

## 2020-06-05 VITALS — BP 132/70 | HR 68 | Ht 62.0 in | Wt 195.4 lb

## 2020-06-05 DIAGNOSIS — K5904 Chronic idiopathic constipation: Secondary | ICD-10-CM

## 2020-06-05 DIAGNOSIS — R1013 Epigastric pain: Secondary | ICD-10-CM

## 2020-06-05 MED ORDER — PANTOPRAZOLE SODIUM 40 MG PO TBEC: 40.0000 mg | DELAYED_RELEASE_TABLET | Freq: Every day | ORAL | 3 refills | Status: DC

## 2020-06-05 MED ORDER — LINACLOTIDE 290 MCG PO CAPS: 290.0000 ug | ORAL_CAPSULE | Freq: Every day | ORAL | 3 refills | Status: DC

## 2020-06-05 MED ORDER — NA SULFATE-K SULFATE-MG SULF 17.5-3.13-1.6 GM/177ML PO SOLN: ORAL | 0 refills | Status: DC

## 2020-06-05 NOTE — Progress Notes (Addendum)
[name]    [id number]    Mar 01, 1951  Primary Care Physician:Richter, Maebelle Munroe, MD  Referring Physician: Hayden Rasmussen, MD [address] [place],  [place] [address]   Chief complaint:  Epigastric abd pain, constipation  HPI:  69 yr very pleasant female here for new patient visit with complaints of epigastric abdominal pain and constipation  Epigastric abd pain since last day of Aug, after she drank a green smoothie , since then she has been having abdominal bloating and excessive gas associated with epigastric abdominal pain She has history of chronic constipation but her symptoms are occurring worse as she is aging  Abdominal bloating and pain is worse when she eats excess gluten.  She also notices that the pain is worse as the day goes by.  Denies any melena or blood per rectum.  No unintentional weight loss.  She had negative Cologaurd Jan 2020.     Outpatient Encounter Medications as of 06/05/2020  Medication Sig   b complex vitamins tablet Take 1 tablet by mouth daily.   Calcium-Magnesium-Vitamin D (CALCIUM MAGNESIUM PO) Take 1 tablet by mouth daily.   calcium-vitamin D (OSCAL WITH D) 500-200 MG-UNIT per tablet Take 1 tablet by mouth daily.    cholecalciferol (VITAMIN D) 1000 UNITS tablet Take 1,000 Units by mouth daily.   Dorzolamide HCl-Timolol Mal PF 2-0.5 % SOLN INT 1 GTT IN OU BID   EPINEPHrine (EPI-PEN) 0.3 mg/0.3 mL DEVI Inject 0.3 mg into the muscle once.     Multiple Vitamins-Minerals (MULTIVITAMINS THER. W/MINERALS) TABS Take 1 tablet by mouth daily.     polyethylene glycol (MIRALAX / GLYCOLAX) 17 g packet Take 17 g by mouth daily.   TRAVATAN Z 0.004 % SOLN ophthalmic solution INT 1 GTT IN OU HS   vitamin C (ASCORBIC ACID) 500 MG tablet Take 2,000 mg by mouth daily.    [DISCONTINUED] timolol (TIMOPTIC) 0.5 % ophthalmic solution PLACE 1 GTT INTO OU BID   [DISCONTINUED] TIMOLOL MALEATE OP Apply to eye.   No  facility-administered encounter medications on file as of 06/05/2020.    Allergies as of 06/05/2020 - Review Complete 06/05/2020  Allergen Reaction Noted   Ace inhibitors Other (See Comments) 06/21/2011   Aripiprazole Other (See Comments) 06/21/2011   Cefixime Other (See Comments) 06/21/2011   Codeine Other (See Comments) 06/21/2011   Effexor [venlafaxine hydrochloride] Other (See Comments) 06/21/2011   Escitalopram oxalate Other (See Comments) 06/21/2011   Geodon [ziprasidone hydrochloride] Other (See Comments) 06/21/2011   Lamictal [lamotrigine] Other (See Comments) 06/21/2011   Lithium Other (See Comments) 06/21/2011   Nortriptyline Other (See Comments) 06/21/2011   Prozac [fluoxetine hcl] Other (See Comments) 06/21/2011   Sertraline hcl Other (See Comments) 06/21/2011   Erythromycin Rash 06/21/2011   Latex Itching and Rash 04/27/2015   Penicillins Rash 06/21/2011    Past Medical History:  Diagnosis Date   Anxiety    Arthritis    Bruises easily    Cataracts, bilateral    immature   Chronic back pain    DDD   COPD (chronic obstructive pulmonary disease) ([place])    did have chronic issues as a child and allergies and mom smoked   Depression    takes Cymbalta daily   Dry skin    Family history of adverse reaction to anesthesia    mom wakes up fighting and sick   Fibromyalgia    GERD (gastroesophageal reflux disease)  occasionally but will take Tums if needed   Glaucoma    History of bronchitis early Aug 2016   History of shingles    IBS (irritable bowel syndrome)    Insomnia    Interstitial cystitis    Joint pain    Joint swelling    Obesity    Osteoarthritis    Osteopenia    Panic attacks    PONV (postoperative nausea and vomiting)    Urinary frequency    Urinary urgency    Vertigo    Weakness    in hands    Past Surgical History:  Procedure Laterality Date   ABDOMINAL HYSTERECTOMY  2007   with bladder sling     COLONOSCOPY     ESOPHAGOGASTRODUODENOSCOPY     FOOT SURGERY Right    LEG SURGERY Right    TOTAL KNEE ARTHROPLASTY Right 05/08/2015   TOTAL KNEE ARTHROPLASTY Right 05/08/2015   Procedure: TOTAL KNEE ARTHROPLASTY;  Surgeon: Garald Balding, MD;  Location: [facility];  Service: Orthopedics;  Laterality: Right;    Family History  Problem Relation Age of Onset   Cancer Mother    Osteoarthritis Mother    Irritable bowel syndrome Mother    Colon cancer Neg Hx    Esophageal cancer Neg Hx     Social History   Socioeconomic History   Marital status: Married    Spouse name: Not on file   Number of children: Not on file   Years of education: Not on file   Highest education level: Not on file  Occupational History   Not on file  Tobacco Use   Smoking status: Never Smoker   Smokeless tobacco: Never Used  Vaping Use   Vaping Use: Never used  Substance and Sexual Activity   Alcohol use: No   Drug use: No   Sexual activity: Yes    Birth control/protection: Surgical  Other Topics Concern   Not on file  Social History Narrative   Not on file   Social Determinants of Health   Financial Resource Strain:    Difficulty of Paying Living Expenses: Not on file  Food Insecurity:    Worried About [place] in the Last Year: Not on file   [employer] of Food in the Last Year: Not on file  Transportation Needs:    Lack of Transportation (Medical): Not on file   Lack of Transportation (Non-Medical): Not on file  Physical Activity:    Days of Exercise per Week: Not on file   Minutes of Exercise per Session: Not on file  Stress:    Feeling of Stress : Not on file  Social Connections:    Frequency of Communication with Friends and Family: Not on file   Frequency of Social Gatherings with Friends and Family: Not on file   Attends Religious Services: Not on file   Active Member of Clubs or Organizations: Not on file   Attends Archivist  Meetings: Not on file   Marital Status: Not on file  Intimate Partner Violence:    Fear of Current or Ex-Partner: Not on file   Emotionally Abused: Not on file   Physically Abused: Not on file   Sexually Abused: Not on file      Review of systems: All other review of systems negative except as mentioned in the HPI.   Physical Exam: Vitals:   06/05/20 1402  BP: 132/70  Pulse: 68   Body mass index is 35.73 kg/m.  Gen:      No acute distress HEENT:  sclera anicteric Abd:      soft, non-tender; no palpable masses, no distension Ext:    No edema Neuro: alert and oriented x 3 Psych: normal mood and affect  Data Reviewed:  Reviewed labs, radiology imaging, old records and pertinent past GI work up   Assessment and Plan/Recommendations:  69 year old very pleasant female with complaints of epigastric pain and worsening constipation associated with abdominal bloating and excessive gas  Schedule for EGD to exclude peptic ulcer disease, erosive gastritis or sprue Start Protonix 40 mg daily Avoid NSAIDs  Plan to proceed with colonoscopy along with EGD to evaluate recent change in bowel habits with worsening constipation, exclude any neoplastic lesion Start Linzess 290 mcg daily for chronic idiopathic constipation, titrate the dose based on response Hold MiraLAX while taking Linzess to prevent diarrhea  The risks and benefits as well as alternatives of endoscopic procedure(s) have been discussed and reviewed. All questions answered. The patient agrees to proceed.    This visit required 75 minutes of patient care (this includes precharting, chart review, review of results, face-to-face time used for counseling as well as treatment plan and follow-up. The patient was provided an opportunity to ask questions and all were answered. The patient agreed with the plan and demonstrated an understanding of the instructions.  Damaris Hippo , MD    CC: Hayden Rasmussen, MD

## 2020-06-05 NOTE — Patient Instructions (Signed)
You have been scheduled for an endoscopy and colonoscopy. Please follow the written instructions given to you at your visit today. Please pick up your prep supplies at the pharmacy within the next 1-3 days. If you use inhalers (even only as needed), please bring them with you on the day of your procedure.   We have sent Protonix and Linzess to your pharmacy  Due to recent changes in healthcare laws, you may see the results of your imaging and laboratory studies on MyChart before your provider has had a chance to review them.  We understand that in some cases there may be results that are confusing or concerning to you. Not all laboratory results come back in the same time frame and the provider may be waiting for multiple results in order to interpret others.  Please give [place] 48 hours in order for your provider to thoroughly review all the results before contacting the office for clarification of your results.   If you are age 74 or older, your body mass index should be between 23-30. Your Body mass index is 35.73 kg/m. If this is out of the aforementioned range listed, please consider follow up with your Primary Care Provider.  If you are age 27 or younger, your body mass index should be between 19-25. Your Body mass index is 35.73 kg/m. If this is out of the aformentioned range listed, please consider follow up with your Primary Care Provider.    I appreciate the  opportunity to care for you  Thank You   Harl Bowie , MD

## 2020-06-14 ENCOUNTER — Encounter: Payer: Self-pay | Admitting: Gastroenterology

## 2020-06-18 DIAGNOSIS — H43392 Other vitreous opacities, left eye: Secondary | ICD-10-CM | POA: Diagnosis not present

## 2020-06-18 DIAGNOSIS — H401211 Low-tension glaucoma, right eye, mild stage: Secondary | ICD-10-CM | POA: Diagnosis not present

## 2020-06-18 DIAGNOSIS — G500 Trigeminal neuralgia: Secondary | ICD-10-CM | POA: Diagnosis not present

## 2020-06-18 DIAGNOSIS — H2513 Age-related nuclear cataract, bilateral: Secondary | ICD-10-CM | POA: Diagnosis not present

## 2020-06-18 DIAGNOSIS — H43811 Vitreous degeneration, right eye: Secondary | ICD-10-CM | POA: Diagnosis not present

## 2020-06-18 DIAGNOSIS — H401222 Low-tension glaucoma, left eye, moderate stage: Secondary | ICD-10-CM | POA: Diagnosis not present

## 2020-06-18 DIAGNOSIS — H16223 Keratoconjunctivitis sicca, not specified as Sjogren's, bilateral: Secondary | ICD-10-CM | POA: Diagnosis not present

## 2020-06-19 DIAGNOSIS — J45909 Unspecified asthma, uncomplicated: Secondary | ICD-10-CM | POA: Diagnosis not present

## 2020-06-19 DIAGNOSIS — N3281 Overactive bladder: Secondary | ICD-10-CM | POA: Diagnosis not present

## 2020-06-19 DIAGNOSIS — F411 Generalized anxiety disorder: Secondary | ICD-10-CM | POA: Diagnosis not present

## 2020-06-19 DIAGNOSIS — Z23 Encounter for immunization: Secondary | ICD-10-CM | POA: Diagnosis not present

## 2020-06-19 DIAGNOSIS — Z1152 Encounter for screening for COVID-19: Secondary | ICD-10-CM | POA: Diagnosis not present

## 2020-06-19 DIAGNOSIS — F4310 Post-traumatic stress disorder, unspecified: Secondary | ICD-10-CM | POA: Diagnosis not present

## 2020-08-01 ENCOUNTER — Telehealth: Payer: Self-pay | Admitting: Gastroenterology

## 2020-08-01 NOTE — Telephone Encounter (Signed)
Pt is requesting a call back from a nurse to discuss her continuous diarrhea symptoms, pt is scheduled for a Colonoscopy on 08/03/2020

## 2020-08-01 NOTE — Telephone Encounter (Signed)
Patient is on Linzess for chronic constipation. She started this medication in November 2021. She calls with complaints of 2 weeks of diarrhea and explosive gas. She will have up to 3 stools a day. States stool and gas are extremely foul smelling. She constantly feels "full" and "full of gas." Her EGD and colonoscopy are scheduled for 08/03/20.

## 2020-08-01 NOTE — Telephone Encounter (Signed)
Hold Linzess, she will need lower dose Linzess 72 mcg daily instead of 243mcg daily, we can send her Rx after colonoscopy based on findings.  Thanks

## 2020-08-01 NOTE — Telephone Encounter (Signed)
Patient is advised.  

## 2020-08-03 ENCOUNTER — Other Ambulatory Visit: Payer: Self-pay

## 2020-08-03 ENCOUNTER — Other Ambulatory Visit: Payer: Self-pay | Admitting: Gastroenterology

## 2020-08-03 ENCOUNTER — Encounter: Payer: Self-pay | Admitting: Gastroenterology

## 2020-08-03 ENCOUNTER — Ambulatory Visit (AMBULATORY_SURGERY_CENTER): Payer: Medicare Other | Admitting: Gastroenterology

## 2020-08-03 ENCOUNTER — Telehealth: Payer: Self-pay | Admitting: Gastroenterology

## 2020-08-03 VITALS — BP 130/68 | HR 62 | Temp 97.0°F | Resp 14 | Ht 62.0 in | Wt 195.0 lb

## 2020-08-03 DIAGNOSIS — K648 Other hemorrhoids: Secondary | ICD-10-CM | POA: Diagnosis not present

## 2020-08-03 DIAGNOSIS — K6289 Other specified diseases of anus and rectum: Secondary | ICD-10-CM | POA: Diagnosis not present

## 2020-08-03 DIAGNOSIS — R194 Change in bowel habit: Secondary | ICD-10-CM

## 2020-08-03 DIAGNOSIS — K319 Disease of stomach and duodenum, unspecified: Secondary | ICD-10-CM

## 2020-08-03 DIAGNOSIS — K5900 Constipation, unspecified: Secondary | ICD-10-CM | POA: Diagnosis not present

## 2020-08-03 DIAGNOSIS — K5730 Diverticulosis of large intestine without perforation or abscess without bleeding: Secondary | ICD-10-CM

## 2020-08-03 DIAGNOSIS — K259 Gastric ulcer, unspecified as acute or chronic, without hemorrhage or perforation: Secondary | ICD-10-CM | POA: Diagnosis not present

## 2020-08-03 DIAGNOSIS — K2970 Gastritis, unspecified, without bleeding: Secondary | ICD-10-CM

## 2020-08-03 DIAGNOSIS — R1013 Epigastric pain: Secondary | ICD-10-CM

## 2020-08-03 MED ORDER — SUCRALFATE 1 G PO TABS: 1.0000 g | ORAL_TABLET | Freq: Four times a day (QID) | ORAL | Status: AC

## 2020-08-03 MED ORDER — SODIUM CHLORIDE 0.9 % IV SOLN: 500.0000 mL | Freq: Once | INTRAVENOUS | Status: DC

## 2020-08-03 NOTE — Patient Instructions (Signed)
HANDOUTS PROVIDED ON: Gastritis, Diverticulosis, and Hemorrhoids  The polyps removed/biopsies taken today have been sent for pathology.  The results can take 1-3 weeks to receive.  When your next colonoscopy should occur will be based on the pathology results.    You may resume your previous diet and medication schedule. New prescription for Carafate sent to pharmacy.  Continue Protonix daily.   Thank you for allowing [place] to care for you today!!!      YOU HAD AN ENDOSCOPIC PROCEDURE TODAY AT [facility]:   Refer to the procedure report that was given to you for any specific questions about what was found during the examination.  If the procedure report does not answer your questions, please call your gastroenterologist to clarify.  If you requested that your care partner not be given the details of your procedure findings, then the procedure report has been included in a sealed envelope for you to review at your convenience later.  YOU SHOULD EXPECT: Some feelings of bloating in the abdomen. Passage of more gas than usual.  Walking can help get rid of the air that was put into your GI tract during the procedure and reduce the bloating. If you had a lower endoscopy (such as a colonoscopy or flexible sigmoidoscopy) you may notice spotting of blood in your stool or on the toilet paper. If you underwent a bowel prep for your procedure, you may not have a normal bowel movement for a few days.  Please Note:  You might notice some irritation and congestion in your nose or some drainage.  This is from the oxygen used during your procedure.  There is no need for concern and it should clear up in a day or so.  SYMPTOMS TO REPORT IMMEDIATELY:   Following lower endoscopy (colonoscopy or flexible sigmoidoscopy):  Excessive amounts of blood in the stool  Significant tenderness or worsening of abdominal pains  Swelling of the abdomen that is new, acute  Fever of 100F or  higher   Following upper endoscopy (EGD)  Vomiting of blood or coffee ground material  New chest pain or pain under the shoulder blades  Painful or persistently difficult swallowing  New shortness of breath  Fever of 100F or higher  Black, tarry-looking stools  For urgent or emergent issues, a gastroenterologist can be reached at any hour by calling [phone]. Do not use MyChart messaging for urgent concerns.    DIET:  We do recommend a small meal at first, but then you may proceed to your regular diet.  Drink plenty of fluids but you should avoid alcoholic beverages for 24 hours.  ACTIVITY:  You should plan to take it easy for the rest of today and you should NOT DRIVE or use heavy machinery until tomorrow (because of the sedation medicines used during the test).    FOLLOW UP: Our staff will call the number listed on your records 48-72 hours following your procedure to check on you and address any questions or concerns that you may have regarding the information given to you following your procedure. If we do not reach you, we will leave a message.  We will attempt to reach you two times.  During this call, we will ask if you have developed any symptoms of COVID 19. If you develop any symptoms (ie: fever, flu-like symptoms, shortness of breath, cough etc.) before then, please call [phone].  If you test positive for Covid 19 in the 2 weeks post procedure, please  call and report this information to [place].    If any biopsies were taken you will be contacted by phone or by letter within the next 1-3 weeks.  Please call us at [phone] if you have not heard about the biopsies in 3 weeks.    SIGNATURES/CONFIDENTIALITY: You and/or your care partner have signed paperwork which will be entered into your electronic medical record.  These signatures attest to the fact that that the information above on your After Visit Summary has been reviewed and is understood.  Full responsibility of  the confidentiality of this discharge information lies with you and/or your care-partner.

## 2020-08-03 NOTE — Op Note (Signed)
[place] Patient Name: [name] Procedure Date: 08/03/2020 2:04 PM MRN: [id number] Endoscopist: Mauri Pole , MD Age: 70 Referring MD:  Date of Birth: Nov 02, 1950 Gender: Female Account #: [id number] Procedure:                Upper GI endoscopy Indications:              Epigastric abdominal pain, Dyspepsia Medicines:                Monitored Anesthesia Care Procedure:                Pre-Anesthesia Assessment:                           - Prior to the procedure, a History and Physical                            was performed, and patient medications and                            allergies were reviewed. The patient's tolerance of                            previous anesthesia was also reviewed. The risks                            and benefits of the procedure and the sedation                            options and risks were discussed with the patient.                            All questions were answered, and informed consent                            was obtained. Prior Anticoagulants: The patient has                            taken no previous anticoagulant or antiplatelet                            agents. ASA Grade Assessment: II - A patient with                            mild systemic disease. After reviewing the risks                            and benefits, the patient was deemed in                            satisfactory condition to undergo the procedure.                           After obtaining informed consent, the endoscope was  passed under direct vision. Throughout the                            procedure, the patient's blood pressure, pulse, and                            oxygen saturations were monitored continuously. The                            Endoscope was introduced through the mouth, and                            advanced to the second part of duodenum. The upper                            GI endoscopy  was accomplished without difficulty.                            The patient tolerated the procedure well. Scope In: Scope Out: Findings:                 The gastroesophageal flap valve was visualized                            endoscopically and classified as Hill Grade II                            (fold present, opens with respiration).                           The Z-line was regular and was found 37 cm from the                            incisors.                           Few non-bleeding superficial gastric ulcers were                            found in the gastric antrum. The largest lesion was                            10 mm in largest dimension. Biopsies were taken                            with a cold forceps for histology.                           Patchy mild inflammation characterized by                            congestion (edema), erosions and erythema was found                            in the entire examined  stomach. Biopsies were taken                            with a cold forceps for Helicobacter pylori testing.                           Patchy mildly erythematous mucosa without active                            bleeding was found in the duodenal bulb. Biopsies                            were taken with a cold forceps for histology. Complications:            No immediate complications. Estimated Blood Loss:     Estimated blood loss was minimal. Impression:               - Gastroesophageal flap valve classified as Hill                            Grade II (fold present, opens with respiration).                           - Z-line regular, 37 cm from the incisors.                           - Non-bleeding gastric ulcers. Biopsied.                           - Gastritis. Biopsied.                           - Erythematous duodenopathy. Biopsied. Recommendation:           - Resume previous diet.                           - Continue present medications.                            - Use sucralfate tablets 1 gram PO QID for 1 month.                           - Await pathology results.                           - Continue Protonix 40mg  daily                           - See the other procedure note for documentation of                            additional recommendations. Mauri Pole, MD 08/03/2020 2:48:45 PM This report has been signed electronically.

## 2020-08-03 NOTE — Telephone Encounter (Signed)
Ok, thank you

## 2020-08-03 NOTE — Op Note (Signed)
[facility] Patient Name: [name] Procedure Date: 08/03/2020 2:04 PM MRN: [id number] Endoscopist: Mauri Pole , MD Age: 70 Referring MD:  Date of Birth: September 24, 1950 Gender: Female Account #: [id number] Procedure:                Colonoscopy Indications:              Change in bowel habits, Change in stool caliber,                            Constipation Medicines:                Propofol per Anesthesia Procedure:                Pre-Anesthesia Assessment:                           - Prior to the procedure, a History and Physical                            was performed, and patient medications and                            allergies were reviewed. The patient's tolerance of                            previous anesthesia was also reviewed. The risks                            and benefits of the procedure and the sedation                            options and risks were discussed with the patient.                            All questions were answered, and informed consent                            was obtained. Prior Anticoagulants: The patient has                            taken no previous anticoagulant or antiplatelet                            agents. ASA Grade Assessment: II - A patient with                            mild systemic disease. After reviewing the risks                            and benefits, the patient was deemed in                            satisfactory condition to undergo the procedure.  After obtaining informed consent, the colonoscope                            was passed under direct vision. Throughout the                            procedure, the patient's blood pressure, pulse, and                            oxygen saturations were monitored continuously. The                            Olympus [id number]) Colonoscope was                            introduced through the anus and advanced to the  the                            cecum, identified by appendiceal orifice and                            ileocecal valve. The colonoscopy was performed                            without difficulty. The patient tolerated the                            procedure well. The quality of the bowel                            preparation was adequate. The ileocecal valve,                            appendiceal orifice, and rectum were photographed. Scope In: 2:22:45 PM Scope Out: 2:42:27 PM Scope Withdrawal Time: 0 hours 8 minutes 24 seconds  Total Procedure Duration: 0 hours 19 minutes 42 seconds  Findings:                 The perianal and digital rectal examinations were                            normal.                           A 9 mm polypoid lesion was found at the anus. The                            lesion was polypoid. No bleeding was present. This                            was biopsied with a cold forceps for histology.                           A few small and large-mouthed diverticula were  found in the sigmoid colon and descending colon.                           Non-bleeding external and internal hemorrhoids were                            found during retroflexion. The hemorrhoids were                            small. Complications:            No immediate complications. Estimated Blood Loss:     Estimated blood loss was minimal. Impression:               - Rule out malignancy, polypoid lesion at the anus.                            Biopsied.                           - Diverticulosis in the sigmoid colon and in the                            descending colon.                           - Non-bleeding external and internal hemorrhoids. Recommendation:           - Patient has a contact number available for                            emergencies. The signs and symptoms of potential                            delayed complications were discussed with the                             patient. Return to normal activities tomorrow.                            Written discharge instructions were provided to the                            patient.                           - Resume previous diet.                           - Continue present medications.                           - Await pathology results.                           - Repeat colonoscopy in 5-10 years for surveillance  based on pathology results. Mauri Pole, MD 08/03/2020 2:51:27 PM This report has been signed electronically.

## 2020-08-03 NOTE — Telephone Encounter (Signed)
Patient called with questions about prep Plan for EGD and colonoscopy later today with Dr. Silverio Decamp She took the first half of the prep and 3 hours later had had no bowel movement.  She called her pharmacy and they advise she proceed with the second half of the prep.  She has had numerous bowel movements by this point. Reports bowel movements now is clear with a scant amount of sediment in the toilet.  Certainly not dark or with any solid material  I reassured her that she should be ready without needing any additional preparation at this time  She will plan to present as scheduled

## 2020-08-03 NOTE — Telephone Encounter (Signed)
Patient left message stating she had trouble with prep solution and is needing a nurse to call her today not sure if she is prep correctly

## 2020-08-03 NOTE — Progress Notes (Signed)
Called to room to assist during endoscopic procedure.  Patient ID and intended procedure confirmed with present staff. Received instructions for my participation in the procedure from the performing physician. °

## 2020-08-03 NOTE — Progress Notes (Signed)
To PACU, VSS. Report to [email or web address] 

## 2020-08-07 ENCOUNTER — Telehealth: Payer: Self-pay

## 2020-08-07 NOTE — Telephone Encounter (Signed)
  Follow up Call-  Call back number 08/03/2020  Post procedure Call Back phone  # [phone]  Permission to leave phone message Yes  Some recent data might be hidden     Patient questions:  Do you have a fever, pain , or abdominal swelling? No. Pain Score  0 *  Have you tolerated food without any problems? Yes.    Have you been able to return to your normal activities? Yes.    Do you have any questions about your discharge instructions: Diet   No. Medications  No. Follow up visit  No.  Do you have questions or concerns about your Care? No.  Actions: * If pain score is 4 or above: No action needed, pain <4.  1. Have you developed a fever since your procedure? no  2.   Have you had an respiratory symptoms (SOB or cough) since your procedure? no  3.   Have you tested positive for COVID 19 since your procedure no  4.   Have you had any family members/close contacts diagnosed with the COVID 19 since your procedure?  no   If yes to any of these questions please route to Joylene John, RN and Joella Prince, RN

## 2020-08-16 ENCOUNTER — Telehealth: Payer: Self-pay | Admitting: Gastroenterology

## 2020-08-16 NOTE — Telephone Encounter (Signed)
Pt calling inquiring about path results. Pls call her.

## 2020-08-21 NOTE — Telephone Encounter (Signed)
Hi Dr. Silverio Decamp, pt returned your call. She is requesting another call if possible. Thank you.

## 2020-08-21 NOTE — Telephone Encounter (Signed)
Called patient to discuss results, did not reach left a message to call back She has AIN type I (benign precancerous lesion), Please send referral to colorectal surgery for exam and removal of any remaining lesion. Thanks

## 2020-08-28 ENCOUNTER — Other Ambulatory Visit: Payer: Self-pay | Admitting: Gastroenterology

## 2020-08-28 NOTE — Telephone Encounter (Signed)
Called patient and discussed results in detail. Thanks

## 2020-08-31 ENCOUNTER — Other Ambulatory Visit: Payer: Self-pay | Admitting: Gastroenterology

## 2020-09-24 ENCOUNTER — Ambulatory Visit: Payer: Self-pay | Admitting: General Surgery

## 2020-09-24 NOTE — H&P (Signed)
History of Present Illness Leighton Ruff MD; 9/40/7680 10:31 AM) The patient is a 70 year old female who presents with anal lesions. 69 year old female who presents to the office for evaluation of an anal lesion seen on colonoscopy. This is approximately 1 cm in size. Biopsy showed AIN grade 1. She is currently asymptomatic.   Past Surgical History Janeann Forehand, CNA; 09/24/2020 10:27 AM) Foot Surgery  Bilateral. Hysterectomy (not due to cancer) - Complete  Knee Surgery  Right. Oral Surgery   Diagnostic Studies History Janeann Forehand, CNA; 09/24/2020 10:27 AM) Colonoscopy  within last year Mammogram  1-3 years ago Pap Smear  1-5 years ago  Allergies Janeann Forehand, CNA; 09/24/2020 10:28 AM) Latex Exam Gloves *MEDICAL DEVICES AND SUPPLIES*  Penicillins  Allergies Reconciled   Medication History Janeann Forehand, CNA; 09/24/2020 10:29 AM) Azithromycin (250MG Tablet, Oral) Active. buPROPion HCl ER (XL) (150MG Tablet ER 24HR, Oral) Active. Clindamycin HCl (150MG Capsule, Oral) Active. Dorzolamide HCl-Timolol Mal PF (2-0.5% Solution, Ophthalmic) Active. Doxycycline Hyclate (100MG Capsule, Oral) Active. Gabapentin (300MG Capsule, Oral) Active. hydrOXYzine HCl (10MG Tablet, Oral) Active. Ketorolac Tromethamine (0.4% Solution, Ophthalmic) Active. Linzess (290MCG Capsule, Oral) Active. Ofloxacin (0.3% Solution, Ophthalmic) Active. Pantoprazole Sodium (40MG Tablet DR, Oral) Active. PEG 3350-KCl-Na Bicarb-NaCl (420GM For Solution, Oral) Active. prednisoLONE Acetate (25MG/ML Suspension, Injection) Active. prednisoLONE Acetate (25MG/ML Suspension, Injection) Active. predniSONE (10MG Tablet, Oral) Active. prednisoLONE Acetate (1% Suspension, Ophthalmic) Active. Sucralfate (1GM Tablet, Oral) Active. Suprep Bowel Prep Kit (17.5-3.13-1.6GM/177ML Solution, Oral) Active. Travoprost (BAK Free) (0.004% Solution, Ophthalmic) Active. valACYclovir HCl (1GM  Tablet, Oral) Active. Medications Reconciled  Social History Janeann Forehand, CNA; 09/24/2020 10:27 AM) Alcohol use  Remotely quit alcohol use. Caffeine use  Coffee, Tea. No drug use  Tobacco use  Never smoker.  Family History Janeann Forehand, CNA; 09/24/2020 10:27 AM) Anesthetic complications  Mother. Arthritis  Mother. Colon Polyps  Sister. Depression  Father, Sister. Heart Disease  Sister. Ischemic Bowel Disease  Mother. Migraine Headache  Mother. Prostate Cancer  Father. Respiratory Condition  Mother.  Pregnancy / Birth History Janeann Forehand, CNA; 09/24/2020 10:27 AM) Age at menarche  2 years. Age of menopause  64-55 Gravida  5 Length (months) of breastfeeding  7-12 Maternal age  53-25 Para  54  Other Problems Janeann Forehand, CNA; 09/24/2020 10:27 AM) Anxiety Disorder  Arthritis  Back Pain  Bladder Problems  Chronic Obstructive Lung Disease  Depression  General anesthesia - complications  Hemorrhoids  Melanoma  Oophorectomy  Bilateral. Other disease, cancer, significant illness     Review of Systems Janeann Forehand CNA; 09/24/2020 10:27 AM) General Present- Fatigue and Weight Loss. Not Present- Appetite Loss, Chills, Fever, Night Sweats and Weight Gain. Skin Present- Dryness. Not Present- Change in Wart/Mole, Hives, Jaundice, New Lesions, Non-Healing Wounds, Rash and Ulcer. HEENT Present- Hearing Loss, Nose Bleed, Seasonal Allergies, Sinus Pain, Visual Disturbances and Wears glasses/contact lenses. Not Present- Earache, Hoarseness, Oral Ulcers, Ringing in the Ears, Sore Throat and Yellow Eyes. Respiratory Present- Snoring. Not Present- Bloody sputum, Chronic Cough, Difficulty Breathing and Wheezing. Breast Not Present- Breast Mass, Breast Pain, Nipple Discharge and Skin Changes. Cardiovascular Not Present- Chest Pain, Difficulty Breathing Lying Down, Leg Cramps, Palpitations, Rapid Heart Rate, Shortness of Breath and Swelling of  Extremities. Gastrointestinal Present- Abdominal Pain, Bloating, Constipation, Excessive gas and Hemorrhoids. Not Present- Bloody Stool, Change in Bowel Habits, Chronic diarrhea, Difficulty Swallowing, Gets full quickly at meals, Indigestion, Nausea, Rectal Pain and Vomiting. Female Genitourinary Present- Frequency, Nocturia and Urgency. Not Present- Painful Urination  and Pelvic Pain. Musculoskeletal Present- Back Pain, Joint Pain, Joint Stiffness, Muscle Pain, Muscle Weakness and Swelling of Extremities. Neurological Present- Decreased Memory, Trouble walking and Weakness. Not Present- Fainting, Headaches, Numbness, Seizures, Tingling and Tremor. Psychiatric Present- Anxiety and Depression. Not Present- Bipolar, Change in Sleep Pattern, Fearful and Frequent crying. Endocrine Present- Cold Intolerance and Heat Intolerance. Not Present- Excessive Hunger, Hair Changes, Hot flashes and New Diabetes. Hematology Present- Easy Bruising. Not Present- Blood Thinners, Excessive bleeding, Gland problems, HIV and Persistent Infections.  Vitals Adriana Reams Alston CNA; 09/24/2020 10:29 AM) 09/24/2020 10:29 AM Weight: 198.13 lb Height: 62in Body Surface Area: 1.9 m Body Mass Index: 36.24 kg/m  Temp.: 97.3F  Pulse: 59 (Regular)  P.OX: 95% (Room air) BP: 136/80(Sitting, Left Arm, Standard)       Physical Exam Leighton Ruff MD; 3/36/1224 10:47 AM) General Mental Status-Alert. General Appearance-Cooperative.  Rectal Anorectal Exam External - normal external exam. Internal - normal sphincter tone.   Results Leighton Ruff MD; 4/97/5300 10:49 AM) Procedures  Name Value Date ANOSCOPY, DIAGNOSTIC (51102) [ Hemorrhoids ] Procedure Other: Procedure: Anoscopy....Marland KitchenMarland KitchenSurgeon: Marcello Moores....Marland KitchenMarland KitchenAfter the risks and benefits were explained, verbal consent was obtained for above procedure. A medical assistant chaperone was present thoroughout the entire procedure. ....Marland KitchenMarland KitchenAnesthesia:  none....Marland KitchenMarland KitchenDiagnosis: AIN....Marland KitchenMarland KitchenFindings: 3-4 mm left lateral anal canal lesion. Minimal hemorrhoid disease.  Performed: 09/24/2020 10:49 AM    Assessment & Plan Leighton Ruff MD; 07/24/7354 10:50 AM) AIN GRADE I (P01.41) Impression: 70 year old female status post colonoscopy and biopsy of an anal lesion. This showed angry 1. Lesion appears to be located in the left lateral anal canal. On anoscopy. We discussed chemical ablation here in the office in routine follow-up exams versus high-resolution anoscopy and excision in the operating room. She is elected to proceed with high-resolution anoscopy. We will bring him in anal map of her AIN and will be able to follow this in the office, more specifically. We discussed the risk of bleeding, pain and recurrence. We discussed the alternative treatments in detail. Current Plans

## 2020-10-04 HISTORY — PX: CATARACT EXTRACTION W/ INTRAOCULAR LENS IMPLANT: SHX1309

## 2020-11-06 ENCOUNTER — Other Ambulatory Visit (HOSPITAL_COMMUNITY)
Admission: RE | Admit: 2020-11-06 | Discharge: 2020-11-06 | Disposition: A | Discharge status: Home / Self Care | Payer: Medicare Other | Source: Ambulatory Visit | Attending: General Surgery | Admitting: General Surgery

## 2020-11-06 ENCOUNTER — Encounter (HOSPITAL_BASED_OUTPATIENT_CLINIC_OR_DEPARTMENT_OTHER): Payer: Self-pay | Admitting: General Surgery

## 2020-11-06 DIAGNOSIS — Z20822 Contact with and (suspected) exposure to covid-19: Secondary | ICD-10-CM | POA: Insufficient documentation

## 2020-11-06 DIAGNOSIS — Z01812 Encounter for preprocedural laboratory examination: Secondary | ICD-10-CM | POA: Diagnosis present

## 2020-11-07 ENCOUNTER — Other Ambulatory Visit: Payer: Self-pay

## 2020-11-07 ENCOUNTER — Encounter (HOSPITAL_BASED_OUTPATIENT_CLINIC_OR_DEPARTMENT_OTHER): Payer: Self-pay | Admitting: General Surgery

## 2020-11-07 LAB — SARS CORONAVIRUS 2 (TAT 6-24 HRS): SARS Coronavirus 2: NEGATIVE

## 2020-11-07 NOTE — Progress Notes (Signed)
Spoke w/ via phone for pre-op interview--- PT Lab needs dos---- Istat and EKG              Lab results------ no COVID test ------ done 11-06-2020 megative result in epic Arrive at ------- 0530 on 11-09-2020 NPO after MN NO Solid Food.  Clear liquids from MN until--- 0430 Med rec completed Medications to take morning of surgery ----- Protonix Diabetic medication ----- n/a Patient instructed to bring photo id and insurance card day of surgery Patient aware to have Driver (ride ) / caregiver    for 24 hours after surgery -- husband, [facility] Patient Special Instructions ----- n/a Pre-Op special Istructions ----- n/a Patient verbalized understanding of instructions that were given at this phone interview. Patient denies shortness of breath, chest pain, fever, cough at this phone interview.

## 2020-11-09 ENCOUNTER — Encounter (HOSPITAL_BASED_OUTPATIENT_CLINIC_OR_DEPARTMENT_OTHER)
Admission: RE | Disposition: A | Discharge status: Home / Self Care | Payer: Self-pay | Source: Home / Self Care | Attending: General Surgery

## 2020-11-09 ENCOUNTER — Ambulatory Visit (HOSPITAL_BASED_OUTPATIENT_CLINIC_OR_DEPARTMENT_OTHER): Payer: Medicare Other | Admitting: Anesthesiology

## 2020-11-09 ENCOUNTER — Ambulatory Visit (HOSPITAL_BASED_OUTPATIENT_CLINIC_OR_DEPARTMENT_OTHER)
Admission: RE | Admit: 2020-11-09 | Discharge: 2020-11-09 | Disposition: A | Discharge status: Home / Self Care | Payer: Medicare Other | Attending: General Surgery | Admitting: General Surgery

## 2020-11-09 ENCOUNTER — Encounter (HOSPITAL_BASED_OUTPATIENT_CLINIC_OR_DEPARTMENT_OTHER): Payer: Self-pay | Admitting: General Surgery

## 2020-11-09 DIAGNOSIS — Y658 Other specified misadventures during surgical and medical care: Secondary | ICD-10-CM | POA: Diagnosis not present

## 2020-11-09 DIAGNOSIS — Z880 Allergy status to penicillin: Secondary | ICD-10-CM | POA: Diagnosis not present

## 2020-11-09 DIAGNOSIS — T2155XA Corrosion of first degree of buttock, initial encounter: Secondary | ICD-10-CM | POA: Insufficient documentation

## 2020-11-09 DIAGNOSIS — Z8042 Family history of malignant neoplasm of prostate: Secondary | ICD-10-CM | POA: Insufficient documentation

## 2020-11-09 DIAGNOSIS — Z7952 Long term (current) use of systemic steroids: Secondary | ICD-10-CM | POA: Diagnosis not present

## 2020-11-09 DIAGNOSIS — Z79899 Other long term (current) drug therapy: Secondary | ICD-10-CM | POA: Diagnosis not present

## 2020-11-09 DIAGNOSIS — T542X1A Toxic effect of corrosive acids and acid-like substances, accidental (unintentional), initial encounter: Secondary | ICD-10-CM | POA: Diagnosis not present

## 2020-11-09 DIAGNOSIS — K6282 Dysplasia of anus: Secondary | ICD-10-CM | POA: Insufficient documentation

## 2020-11-09 DIAGNOSIS — Y92234 Operating room of hospital as the place of occurrence of the external cause: Secondary | ICD-10-CM | POA: Insufficient documentation

## 2020-11-09 HISTORY — DX: Generalized anxiety disorder: F41.1

## 2020-11-09 HISTORY — PX: HIGH RESOLUTION ANOSCOPY: SHX6345

## 2020-11-09 HISTORY — DX: Personal history of traumatic brain injury: Z87.820

## 2020-11-09 HISTORY — DX: Trigeminal neuralgia: G50.0

## 2020-11-09 HISTORY — DX: Other intervertebral disc degeneration, lumbosacral region: M51.37

## 2020-11-09 HISTORY — DX: Chronic obstructive pulmonary disease, unspecified: J44.9

## 2020-11-09 HISTORY — DX: Prediabetes: R73.03

## 2020-11-09 HISTORY — DX: Other constipation: K59.09

## 2020-11-09 HISTORY — DX: Personal history of other mental and behavioral disorders: Z86.59

## 2020-11-09 HISTORY — DX: Irritable bowel syndrome with constipation: K58.1

## 2020-11-09 HISTORY — DX: Personal history of other diseases of the respiratory system: Z87.09

## 2020-11-09 HISTORY — DX: Dysplasia of anus: K62.82

## 2020-11-09 HISTORY — DX: Age-related osteoporosis without current pathological fracture: M81.0

## 2020-11-09 HISTORY — DX: Unspecified glaucoma: H40.9

## 2020-11-09 HISTORY — DX: Diverticulosis of large intestine without perforation or abscess without bleeding: K57.30

## 2020-11-09 HISTORY — DX: Other cervical disc degeneration, unspecified cervical region: M50.30

## 2020-11-09 HISTORY — DX: Post-traumatic stress disorder, unspecified: F43.10

## 2020-11-09 HISTORY — DX: Other intervertebral disc degeneration, lumbosacral region without mention of lumbar back pain or lower extremity pain: M51.379

## 2020-11-09 LAB — POCT I-STAT, CHEM 8
BUN: 15 mg/dL (ref 8–23)
Calcium, Ion: 1.2 mmol/L (ref 1.15–1.40)
Chloride: 102 mmol/L (ref 98–111)
Creatinine, Ser: 0.6 mg/dL (ref 0.44–1.00)
Glucose, Bld: 94 mg/dL (ref 70–99)
HCT: 43 % (ref 36.0–46.0)
Hemoglobin: 14.6 g/dL (ref 12.0–15.0)
Potassium: 4.5 mmol/L (ref 3.5–5.1)
Sodium: 140 mmol/L (ref 135–145)
TCO2: 25 mmol/L (ref 22–32)

## 2020-11-09 SURGERY — ANOSCOPY, HIGH RESOLUTION
Anesthesia: Monitor Anesthesia Care

## 2020-11-09 MED ORDER — LACTATED RINGERS IV SOLN: INTRAVENOUS | Status: DC

## 2020-11-09 MED ORDER — OXYCODONE HCL 5 MG/5ML PO SOLN: 5.0000 mg | Freq: Once | ORAL | Status: DC | PRN

## 2020-11-09 MED ORDER — ONDANSETRON HCL 4 MG/2ML IJ SOLN
INTRAMUSCULAR | Status: AC
  Filled 2020-11-09: qty 2

## 2020-11-09 MED ORDER — PROMETHAZINE HCL 25 MG/ML IJ SOLN: 6.2500 mg | INTRAMUSCULAR | Status: DC | PRN

## 2020-11-09 MED ORDER — ACETIC ACID 5 % SOLN
Status: DC | PRN
  Administered 2020-11-09: 1 via TOPICAL

## 2020-11-09 MED ORDER — FENTANYL CITRATE (PF) 100 MCG/2ML IJ SOLN
INTRAMUSCULAR | Status: AC
  Filled 2020-11-09: qty 2

## 2020-11-09 MED ORDER — OXYCODONE HCL 5 MG PO TABS: 5.0000 mg | ORAL_TABLET | Freq: Once | ORAL | Status: DC | PRN

## 2020-11-09 MED ORDER — ACETAMINOPHEN 325 MG PO TABS: 325.0000 mg | ORAL_TABLET | ORAL | Status: DC | PRN

## 2020-11-09 MED ORDER — PROPOFOL 500 MG/50ML IV EMUL
INTRAVENOUS | Status: AC
  Filled 2020-11-09: qty 50

## 2020-11-09 MED ORDER — ACETAMINOPHEN 10 MG/ML IV SOLN
INTRAVENOUS | Status: AC
  Filled 2020-11-09: qty 100

## 2020-11-09 MED ORDER — SODIUM CHLORIDE 0.9% FLUSH: 3.0000 mL | Freq: Two times a day (BID) | INTRAVENOUS | Status: DC

## 2020-11-09 MED ORDER — ONDANSETRON HCL 4 MG/2ML IJ SOLN
INTRAMUSCULAR | Status: DC | PRN
  Administered 2020-11-09: 4 mg via INTRAVENOUS

## 2020-11-09 MED ORDER — TRAMADOL HCL 50 MG PO TABS: 50.0000 mg | ORAL_TABLET | Freq: Four times a day (QID) | ORAL | 0 refills | Status: DC | PRN

## 2020-11-09 MED ORDER — SODIUM CHLORIDE 0.9 % IV SOLN
INTRAVENOUS | Status: AC | PRN
  Administered 2020-11-09: 250 mL via INTRAMUSCULAR

## 2020-11-09 MED ORDER — PROPOFOL 10 MG/ML IV BOLUS
INTRAVENOUS | Status: DC | PRN
  Administered 2020-11-09: 20 mg via INTRAVENOUS

## 2020-11-09 MED ORDER — PROPOFOL 500 MG/50ML IV EMUL
INTRAVENOUS | Status: DC | PRN
  Administered 2020-11-09: 200 ug/kg/min via INTRAVENOUS

## 2020-11-09 MED ORDER — PHENYLEPHRINE 40 MCG/ML (10ML) SYRINGE FOR IV PUSH (FOR BLOOD PRESSURE SUPPORT)
PREFILLED_SYRINGE | INTRAVENOUS | Status: DC | PRN
  Administered 2020-11-09: 40 ug via INTRAVENOUS
  Administered 2020-11-09: 80 ug via INTRAVENOUS
  Administered 2020-11-09: 120 ug via INTRAVENOUS
  Administered 2020-11-09: 40 ug via INTRAVENOUS

## 2020-11-09 MED ORDER — PHENYLEPHRINE 40 MCG/ML (10ML) SYRINGE FOR IV PUSH (FOR BLOOD PRESSURE SUPPORT)
PREFILLED_SYRINGE | INTRAVENOUS | Status: AC
  Filled 2020-11-09: qty 10

## 2020-11-09 MED ORDER — AMISULPRIDE (ANTIEMETIC) 5 MG/2ML IV SOLN: 10.0000 mg | Freq: Once | INTRAVENOUS | Status: DC | PRN

## 2020-11-09 MED ORDER — ACETAMINOPHEN 10 MG/ML IV SOLN
1000.0000 mg | Freq: Once | INTRAVENOUS | Status: DC | PRN
  Administered 2020-11-09: 1000 mg via INTRAVENOUS

## 2020-11-09 MED ORDER — MIDAZOLAM HCL 5 MG/5ML IJ SOLN
INTRAMUSCULAR | Status: DC | PRN
  Administered 2020-11-09: 2 mg via INTRAVENOUS

## 2020-11-09 MED ORDER — BUPIVACAINE-EPINEPHRINE 0.5% -1:200000 IJ SOLN
INTRAMUSCULAR | Status: DC | PRN
  Administered 2020-11-09: 40 mL

## 2020-11-09 MED ORDER — PROPOFOL 10 MG/ML IV BOLUS
INTRAVENOUS | Status: AC
  Filled 2020-11-09: qty 20

## 2020-11-09 MED ORDER — ACETAMINOPHEN 160 MG/5ML PO SOLN: 325.0000 mg | ORAL | Status: DC | PRN

## 2020-11-09 MED ORDER — FENTANYL CITRATE (PF) 100 MCG/2ML IJ SOLN: 25.0000 ug | INTRAMUSCULAR | Status: DC | PRN

## 2020-11-09 MED ORDER — SILVER SULFADIAZINE 1 % EX CREA
TOPICAL_CREAM | CUTANEOUS | Status: DC | PRN
  Administered 2020-11-09: 1 via TOPICAL

## 2020-11-09 MED ORDER — MIDAZOLAM HCL 2 MG/2ML IJ SOLN
INTRAMUSCULAR | Status: AC
  Filled 2020-11-09: qty 2

## 2020-11-09 MED ORDER — FENTANYL CITRATE (PF) 100 MCG/2ML IJ SOLN
INTRAMUSCULAR | Status: DC | PRN
  Administered 2020-11-09: 50 ug via INTRAVENOUS

## 2020-11-09 SURGICAL SUPPLY — 36 items
APL SKNCLS STERI-STRIP NONHPOA (GAUZE/BANDAGES/DRESSINGS) ×1
APL SWBSTK 6 STRL LF DISP (MISCELLANEOUS)
APPLICATOR COTTON TIP 6 STRL (MISCELLANEOUS) IMPLANT
APPLICATOR COTTON TIP 6IN STRL (MISCELLANEOUS)
BENZOIN TINCTURE PRP APPL 2/3 (GAUZE/BANDAGES/DRESSINGS) ×2 IMPLANT
BLADE EXTENDED COATED 6.5IN (ELECTRODE) IMPLANT
BLADE HEX COATED 2.75 (ELECTRODE) ×2 IMPLANT
BLADE SURG 10 STRL SS (BLADE) ×2 IMPLANT
BRIEF STRETCH FOR OB PAD LRG (UNDERPADS AND DIAPERS) ×2 IMPLANT
CANISTER SUCT 3000ML PPV (MISCELLANEOUS) ×2 IMPLANT
COVER BACK TABLE 60X90IN (DRAPES) ×2 IMPLANT
COVER WAND RF STERILE (DRAPES) ×2 IMPLANT
DECANTER SPIKE VIAL GLASS SM (MISCELLANEOUS) ×2 IMPLANT
DRAPE LAPAROTOMY 100X72 PEDS (DRAPES) ×2 IMPLANT
DRAPE UTILITY XL STRL (DRAPES) ×2 IMPLANT
DRSG PAD ABDOMINAL 8X10 ST (GAUZE/BANDAGES/DRESSINGS) ×1 IMPLANT
ELECT REM PT RETURN 9FT ADLT (ELECTROSURGICAL) ×2
ELECTRODE REM PT RTRN 9FT ADLT (ELECTROSURGICAL) ×1 IMPLANT
GAUZE SPONGE 4X4 12PLY STRL (GAUZE/BANDAGES/DRESSINGS) ×1 IMPLANT
GLOVE SURG ENC MOIS LTX SZ6.5 (GLOVE) ×2 IMPLANT
KIT SIGMOIDOSCOPE (SET/KITS/TRAYS/PACK) IMPLANT
KIT TURNOVER CYSTO (KITS) ×2 IMPLANT
NEEDLE HYPO 22GX1.5 SAFETY (NEEDLE) ×2 IMPLANT
NS IRRIG 500ML POUR BTL (IV SOLUTION) ×2 IMPLANT
PACK BASIN DAY SURGERY FS (CUSTOM PROCEDURE TRAY) ×2 IMPLANT
PAD ARMBOARD 7.5X6 YLW CONV (MISCELLANEOUS) IMPLANT
PENCIL SMOKE EVACUATOR (MISCELLANEOUS) ×2 IMPLANT
SPONGE SURGIFOAM ABS GEL 12-7 (HEMOSTASIS) IMPLANT
SUT CHROMIC 2 0 SH (SUTURE) IMPLANT
SUT CHROMIC 3 0 SH 27 (SUTURE) IMPLANT
SYR BULB IRRIG 60ML STRL (SYRINGE) ×2 IMPLANT
SYR CONTROL 10ML LL (SYRINGE) ×2 IMPLANT
TOWEL OR 17X26 10 PK STRL BLUE (TOWEL DISPOSABLE) ×2 IMPLANT
TRAY DSU PREP LF (CUSTOM PROCEDURE TRAY) ×2 IMPLANT
TUBE CONNECTING 12X1/4 (SUCTIONS) ×2 IMPLANT
YANKAUER SUCT BULB TIP NO VENT (SUCTIONS) IMPLANT

## 2020-11-09 NOTE — Anesthesia Preprocedure Evaluation (Addendum)
Anesthesia Evaluation  Patient identified by MRN, date of birth, ID band Patient awake    Reviewed: Allergy & Precautions, NPO status , Patient's Chart, lab work & pertinent test results  History of Anesthesia Complications (+) PONV and history of anesthetic complications  Airway Mallampati: I       Dental no notable dental hx.    Pulmonary COPD,    Pulmonary exam normal        Cardiovascular Normal cardiovascular exam     Neuro/Psych PSYCHIATRIC DISORDERS Anxiety Depression  Neuromuscular disease    GI/Hepatic Neg liver ROS, GERD  Medicated and Controlled,  Endo/Other  negative endocrine ROS  Renal/GU negative Renal ROS     Musculoskeletal  (+) Arthritis , Fibromyalgia -  Abdominal Normal abdominal exam  (+)   Peds  Hematology negative hematology ROS (+)   Anesthesia Other Findings   Reproductive/Obstetrics                           Anesthesia Physical Anesthesia Plan  ASA: III  Anesthesia Plan: MAC   Post-op Pain Management:    Induction: Intravenous  PONV Risk Score and Plan: 4 or greater and Ondansetron, Dexamethasone, Propofol infusion and Midazolam  Airway Management Planned: Natural Airway and Simple Face Mask  Additional Equipment: None  Intra-op Plan:   Post-operative Plan:   Informed Consent: I have reviewed the patients History and Physical, chart, labs and discussed the procedure including the risks, benefits and alternatives for the proposed anesthesia with the patient or authorized representative who has indicated his/her understanding and acceptance.       Plan Discussed with: CRNA  Anesthesia Plan Comments: (Lab Results      Component                Value               Date                      WBC                      6.0                 05/07/2017                HGB                      14.6                11/09/2020                HCT                       43.0                11/09/2020                MCV                      94.9                05/07/2017                PLT                      303  05/07/2017           )       Anesthesia Quick Evaluation

## 2020-11-09 NOTE — Anesthesia Postprocedure Evaluation (Signed)
Anesthesia Post Note  Patient: [name]  Procedure(s) Performed: HIGH RESOLUTION ANOSCOPY WITH EXCISION OF ANAL LESION (N/A )     Patient location during evaluation: PACU Anesthesia Type: MAC Level of consciousness: awake and alert Pain management: pain level controlled Vital Signs Assessment: post-procedure vital signs reviewed and stable Respiratory status: spontaneous breathing, nonlabored ventilation, respiratory function stable and patient connected to nasal cannula oxygen Cardiovascular status: stable and blood pressure returned to baseline Postop Assessment: no apparent nausea or vomiting Anesthetic complications: no   No complications documented.  Last Vitals:  Vitals:   11/09/20 0845 11/09/20 0930  BP: 122/64 140/69  Pulse: (!) 55 (!) 53  Resp: 13 16  Temp: (!) 36.3 C 36.4 C  SpO2: 98% 97%    Last Pain:  Vitals:   11/09/20 0930  TempSrc:   PainSc: [place]

## 2020-11-09 NOTE — H&P (Signed)
The patient is a 70 year old female who presents with anal lesions. 70-year-old female who presents to the office for evaluation of an anal lesion seen on colonoscopy. This is approximately 1 cm in size. Biopsy showed AIN grade 1. She is currently asymptomatic.   Past Surgical History (Donyelle Alston, CNA; 09/24/2020 10:27 AM) Foot Surgery  Bilateral. Hysterectomy (not due to cancer) - Complete  Knee Surgery  Right. Oral Surgery   Diagnostic Studies History (Donyelle Alston, CNA; 09/24/2020 10:27 AM) Colonoscopy  within last year Mammogram  1-3 years ago Pap Smear  1-5 years ago  Allergies  Latex Exam Gloves *MEDICAL DEVICES AND SUPPLIES*  Penicillins  Allergies Reconciled   Medication History Azithromycin (250MG Tablet, Oral) Active. buPROPion HCl ER (XL) (150MG Tablet ER 24HR, Oral) Active. Clindamycin HCl (150MG Capsule, Oral) Active. Dorzolamide HCl-Timolol Mal PF (2-0.5% Solution, Ophthalmic) Active. Doxycycline Hyclate (100MG Capsule, Oral) Active. Gabapentin (300MG Capsule, Oral) Active. hydrOXYzine HCl (10MG Tablet, Oral) Active. Ketorolac Tromethamine (0.4% Solution, Ophthalmic) Active. Linzess (290MCG Capsule, Oral) Active. Ofloxacin (0.3% Solution, Ophthalmic) Active. Pantoprazole Sodium (40MG Tablet DR, Oral) Active. PEG 3350-KCl-Na Bicarb-NaCl (420GM For Solution, Oral) Active. prednisoLONE Acetate (25MG/ML Suspension, Injection) Active. prednisoLONE Acetate (25MG/ML Suspension, Injection) Active. predniSONE (10MG Tablet, Oral) Active. prednisoLONE Acetate (1% Suspension, Ophthalmic) Active. Sucralfate (1GM Tablet, Oral) Active. Suprep Bowel Prep Kit (17.5-3.13-1.6GM/177ML Solution, Oral) Active. Travoprost (BAK Free) (0.004% Solution, Ophthalmic) Active. valACYclovir HCl (1GM Tablet, Oral) Active. Medications Reconciled  Social History (Donyelle Alston, CNA; 09/24/2020 10:27 AM) Alcohol use  Remotely quit alcohol  use. Caffeine use  Coffee, Tea. No drug use  Tobacco use  Never smoker.  Family History (Donyelle Alston, CNA; 09/24/2020 10:27 AM) Anesthetic complications  Mother. Arthritis  Mother. Colon Polyps  Sister. Depression  Father, Sister. Heart Disease  Sister. Ischemic Bowel Disease  Mother. Migraine Headache  Mother. Prostate Cancer  Father. Respiratory Condition  Mother.  Pregnancy / Birth History (Donyelle Alston, CNA; 09/24/2020 10:27 AM) Age at menarche  14 years. Age of menopause  51-55 Gravida  5 Length (months) of breastfeeding  7-12 Maternal age  21-25 Para  3  Other Problems (Donyelle Alston, CNA; 09/24/2020 10:27 AM) Anxiety Disorder  Arthritis  Back Pain  Bladder Problems  Chronic Obstructive Lung Disease  Depression  General anesthesia - complications  Hemorrhoids  Melanoma  Oophorectomy  Bilateral. Other disease, cancer, significant illness     Review of Systems  General Present- Fatigue and Weight Loss. Not Present- Appetite Loss, Chills, Fever, Night Sweats and Weight Gain. Skin Present- Dryness. Not Present- Change in Wart/Mole, Hives, Jaundice, New Lesions, Non-Healing Wounds, Rash and Ulcer. HEENT Present- Hearing Loss, Nose Bleed, Seasonal Allergies, Sinus Pain, Visual Disturbances and Wears glasses/contact lenses. Not Present- Earache, Hoarseness, Oral Ulcers, Ringing in the Ears, Sore Throat and Yellow Eyes. Respiratory Present- Snoring. Not Present- Bloody sputum, Chronic Cough, Difficulty Breathing and Wheezing. Breast Not Present- Breast Mass, Breast Pain, Nipple Discharge and Skin Changes. Cardiovascular Not Present- Chest Pain, Difficulty Breathing Lying Down, Leg Cramps, Palpitations, Rapid Heart Rate, Shortness of Breath and Swelling of Extremities. Gastrointestinal Present- Abdominal Pain, Bloating, Constipation, Excessive gas and Hemorrhoids. Not Present- Bloody Stool, Change in Bowel Habits, Chronic diarrhea,  Difficulty Swallowing, Gets full quickly at meals, Indigestion, Nausea, Rectal Pain and Vomiting. Female Genitourinary Present- Frequency, Nocturia and Urgency. Not Present- Painful Urination and Pelvic Pain. Musculoskeletal Present- Back Pain, Joint Pain, Joint Stiffness, Muscle Pain, Muscle Weakness and Swelling of Extremities. Neurological Present- Decreased Memory, Trouble walking and Weakness. Not Present-   Fainting, Headaches, Numbness, Seizures, Tingling and Tremor. Psychiatric Present- Anxiety and Depression. Not Present- Bipolar, Change in Sleep Pattern, Fearful and Frequent crying. Endocrine Present- Cold Intolerance and Heat Intolerance. Not Present- Excessive Hunger, Hair Changes, Hot flashes and New Diabetes. Hematology Present- Easy Bruising. Not Present- Blood Thinners, Excessive bleeding, Gland problems, HIV and Persistent Infections.  BP (!) 144/77   Pulse 61   Temp 97.8 F (36.6 C) (Oral)   Resp (!) 22   Ht 5' 2" (1.575 m)   Wt 89.7 kg   SpO2 97%   BMI 36.16 kg/m    Physical Exam  General Mental Status-Alert. General Appearance-Cooperative. CV:RRR Lungs: CTA Abd: soft    Procedure Other: Procedure: Anoscopy......Surgeon: Thomas......After the risks and benefits were explained, verbal consent was obtained for above procedure. A medical assistant chaperone was present thoroughout the entire procedure. ......Anesthesia: none......Diagnosis: AIN......Findings: 3-4 mm left lateral anal canal lesion. Minimal hemorrhoid disease.      Assessment & Plan  AIN GRADE I (K62.82) Impression: 69-year-old female status post colonoscopy and biopsy of an anal lesion. This showed AIN1. Lesion appears to be located in the left lateral anal canal. On anoscopy. We discussed chemical ablation here in the office in routine follow-up exams versus high-resolution anoscopy and excision in the operating room. She is elected to proceed with high-resolution  anoscopy. We will bring him in anal map of her AIN and will be able to follow this in the office, more specifically. We discussed the risk of bleeding, pain and recurrence. We discussed the alternative treatments in detail. C 

## 2020-11-09 NOTE — Transfer of Care (Signed)
Immediate Anesthesia Transfer of Care Note  Patient: [name]  Procedure(s) Performed: HIGH RESOLUTION ANOSCOPY WITH EXCISION OF ANAL LESION (N/A )  Patient Location: PACU  Anesthesia Type:MAC  Level of Consciousness: awake, alert , oriented and patient cooperative  Airway & Oxygen Therapy: Patient Spontanous Breathing and Patient connected to face mask oxygen  Post-op Assessment: Report given to RN and Post -op Vital signs reviewed and stable  Post vital signs: Reviewed and stable  Last Vitals:  Vitals Value Taken Time  BP 122/64 11/09/20 0845  Temp 36.3 C 11/09/20 0845  Pulse 61 11/09/20 0854  Resp 22 11/09/20 0855  SpO2 98 % 11/09/20 0854  Vitals shown include unvalidated device data.  Last Pain:  Vitals:   11/09/20 0845  TempSrc:   PainSc: 1       Patients Stated Pain Goal: 3 (93/81/01 7510)  Complications: No complications documented.

## 2020-11-09 NOTE — Op Note (Signed)
11/09/2020  8:19 AM  PATIENT:  [name]  70 y.o. female  Patient Care Team: Hayden Rasmussen, MD as PCP - General (Family Medicine)  PRE-OPERATIVE DIAGNOSIS:  ANAL INTRAEPITHELIA [place] 1  POST-OPERATIVE DIAGNOSIS:  AIN Grade 1  PROCEDURE: HIGH RESOLUTION ANOSCOPY WITH EXCISION OF ANAL LESION    Surgeon(s): Leighton Ruff, MD  ASSISTANT: none   ANESTHESIA:   local and MAC  SPECIMEN:  Source of Specimen:  Left lateral anal canal lesion  DISPOSITION OF SPECIMEN:  PATHOLOGY  COUNTS:  YES  PLAN OF CARE: Discharge to home after PACU  PATIENT DISPOSITION:  PACU - hemodynamically stable.  INDICATION: 70 y.o. female with a anal lesion seen on colonoscopy.  Biopsies show AIN grade 1.  I have recommended a high-resolution anoscopy for evaluation.   OR FINDINGS: Left lateral anal lesion involving the dentate line  DESCRIPTION: the patient was identified in the preoperative holding area and taken to the OR where they were laid on the operating room table.  MAC anesthesia was induced without difficulty. The patient was then positioned in prone jackknife position with buttocks gently taped apart.  The patient was then prepped and draped in usual sterile fashion.  SCDs were noted to be in place prior to the initiation of anesthesia. A surgical timeout was performed indicating the correct patient, procedure, positioning and need for preoperative antibiotics.  A rectal block was performed using Marcaine with epinephrine.  After approximately 12 mL of fluid was injected, the scrub nurse notified me that she had pulled from the wrong bottle and loaded acetic acid into the syringe.  We immediately pulled 100 mL of saline and injected this into the subcutaneous tissues to dilute it.  I then continue with the procedure Identified a lesion in the left lateral anal canal.  No other lesions were noted on anoscopy.   After this was completed, a sponge was soaked in 5% acetic acid was placed over  the perianal region. This was allowed to soak for 2 minutes. The sponge was removed and the perianal region was evaluated with a colposcope.  There were no perianal lesions noted.  The internal anal canal was evaluated via anoscopy with a Hill-Ferguson anoscope.  There were 2 lesions right next to each other in the left lateral anal canal.  These were proximal and distal to the dentate line.  After this was completed, I excised these 2 lesions with Metzenbaum scissors.  The skin was closed with a 3-0 chromic running suture.  I then turned my attention back to the patient's perianal region.  This was injected with at least another 100 mL of saline to help dilute the acetic acid that was injected subcutaneously.  There were several white lesions noted around her anal canal concerning for a chemical burn.  Silvadene was applied to the perianal region.  Dressing was then applied and the patient was awakened from anesthesia and sent to the post anesthesia care unit in stable condition.  All counts were correct per operating room staff.

## 2020-11-09 NOTE — Discharge Instructions (Addendum)
Post Anesthesia Home Care Instructions  Activity: Get plenty of rest for the remainder of the day. A responsible individual must stay with you for 24 hours following the procedure.  For the next 24 hours, DO NOT: -Drive a car -Paediatric nurse -Drink alcoholic beverages -Take any medication unless instructed by your physician -Make any legal decisions or sign important papers.  Meals: Start with liquid foods such as gelatin or soup. Progress to regular foods as tolerated. Avoid greasy, spicy, heavy foods. If nausea and/or vomiting occur, drink only clear liquids until the nausea and/or vomiting subsides. Call your physician if vomiting continues.  Special Instructions/Symptoms: Your throat may feel dry or sore from the anesthesia or the breathing tube placed in your throat during surgery. If this causes discomfort, gargle with warm salt water. The discomfort should disappear within 24 hours.  If you had a scopolamine patch placed behind your ear for the management of post- operative nausea and/or vomiting:  1. The medication in the patch is effective for 72 hours, after which it should be removed.  Wrap patch in a tissue and discard in the trash. Wash hands thoroughly with soap and water. 2. You may remove the patch earlier than 72 hours if you experience unpleasant side effects which may include dry mouth, dizziness or visual disturbances. 3. Avoid touching the patch. Wash your hands with soap and water after contact with the patch.    Post Operative Instructions Following Surgery  The following instructions will help in you comfort postoperatively:  First 24 h  Remove the dressing this evening after you return home from the hospital  Sit in a tub or sitz bath of COOL water for 20 minutes every hour while awake.  After the bath, carefully blot the area dry and place a piece of 100% Cotton with Silvadene on it next to the anal opening.  You may sit on a covered ice pack between the  baths as needed.    You should have crushed ice in small amounts until you are able to urinate.  After you urinate, you may drink fluids.  It is normal to not urinate for the first few hours after surgery.  If you become uncomfortable, call the office for instructions.   Take your pain medications as prescribed  You may eat whatever you feel like.  Start with a light meal and gradually advance your diet.    Beginning the day after your surgery  Sit in a tub of cool to warm water for 15 minutes at least 3 times a day and after bowel movements  After the bowel movement, clean with wet cotton, Tucks or baby wipes.  Apply Silvadene to a thin piece of cotton and place over the anal opening after your baths.  This will collect any drainage or bleeding.  Drainage is usually a pink to tan color and is normal for the following 2-3 weeks after surgery.  Change to cotton ever 2-3 hours while awake.  Diet  Eat a regular diet.  Avoid foods that may constipate you or give you diarrhea.  Avoid foods with seeds, nuts, corn or popcorn.  Beginning the day after surgery, drink 6-8 glasses of water a day in addition to your meals.  Limit you caffeine intake to 1-2 servings per day.  Medication  Take a fiber supplement (Metamucil, Citrucel, FiberCon) twice a day.  Take a stool softener like Colace twice daily  Take your pain medication as directed.  You may use Extra Strength  Tylenol instead of your prescribed pain medication to relieve mild discomfort.  Avoid products containing aspirin.  Bowel Habits  You should have a bowel movement at least every other day.  If you are constipated, you may take a Fleet enema or 2 Dulcolax tablets.  Call the office if no results occur.  You may bear down a normal amount to have a bowel movement without hurting your tissues after the operation.  Activity  Walking is encouraged.  You may drive when you are no longer on prescription pain medication.  You may go up  and down stairs carefully.  No heavy lifting or strenuous activity until after your first post operative appointment  Do NOT sit on a rubber ring; instead use a soft pillow if needed.  Call the office if you have any questions or concerns.  Call IMMEDIATELY if you develop:  Rectal bleeding  Increasing rectal pain  Fever greater than 100 F  Difficulty urinating

## 2020-11-12 ENCOUNTER — Encounter (HOSPITAL_BASED_OUTPATIENT_CLINIC_OR_DEPARTMENT_OTHER): Payer: Self-pay | Admitting: General Surgery

## 2020-11-13 ENCOUNTER — Telehealth: Payer: Self-pay | Admitting: Gastroenterology

## 2020-11-13 LAB — SURGICAL PATHOLOGY

## 2020-11-13 NOTE — Telephone Encounter (Signed)
Patient called said she has been taking Linzess for a long time and she is noticing that her stomach is bloated and gassy seeking advise.

## 2020-11-14 NOTE — Telephone Encounter (Signed)
Spoke with the patient. She had her rectal surgery with Dr Marcello Moores on 11/09/20. She did not have a bowel movement for 2 or 3 days. Now she is having bowel movements that are sticky and difficult to clean from. She does not feel fully emptied afterwards. She has "horrible gas" and bloating. She does not want to take her Linzess 290 mcg today. She is convinced the Linzess is responsible for her symptoms. She is not taking Miralax. Please advise.

## 2020-11-14 NOTE — Telephone Encounter (Signed)
OK please advise patient to start stool softner at bedtime and use Miralax as needed. Increase dietary fiber and water intake. Thanks

## 2020-11-15 NOTE — Telephone Encounter (Signed)
Discussed in detail with  The patient. Also answered questions about IBS-C.

## 2021-04-19 IMAGING — CR DG ABDOMEN ACUTE W/ 1V CHEST
3 series · 3 of 3 positions shown · non-contrast
Comparison: None.

CLINICAL DATA: Epigastric abdominal pain since 02/10/2020.
Constipation.

EXAM:
DG ABDOMEN ACUTE W/ 1V CHEST

[w chest pa]
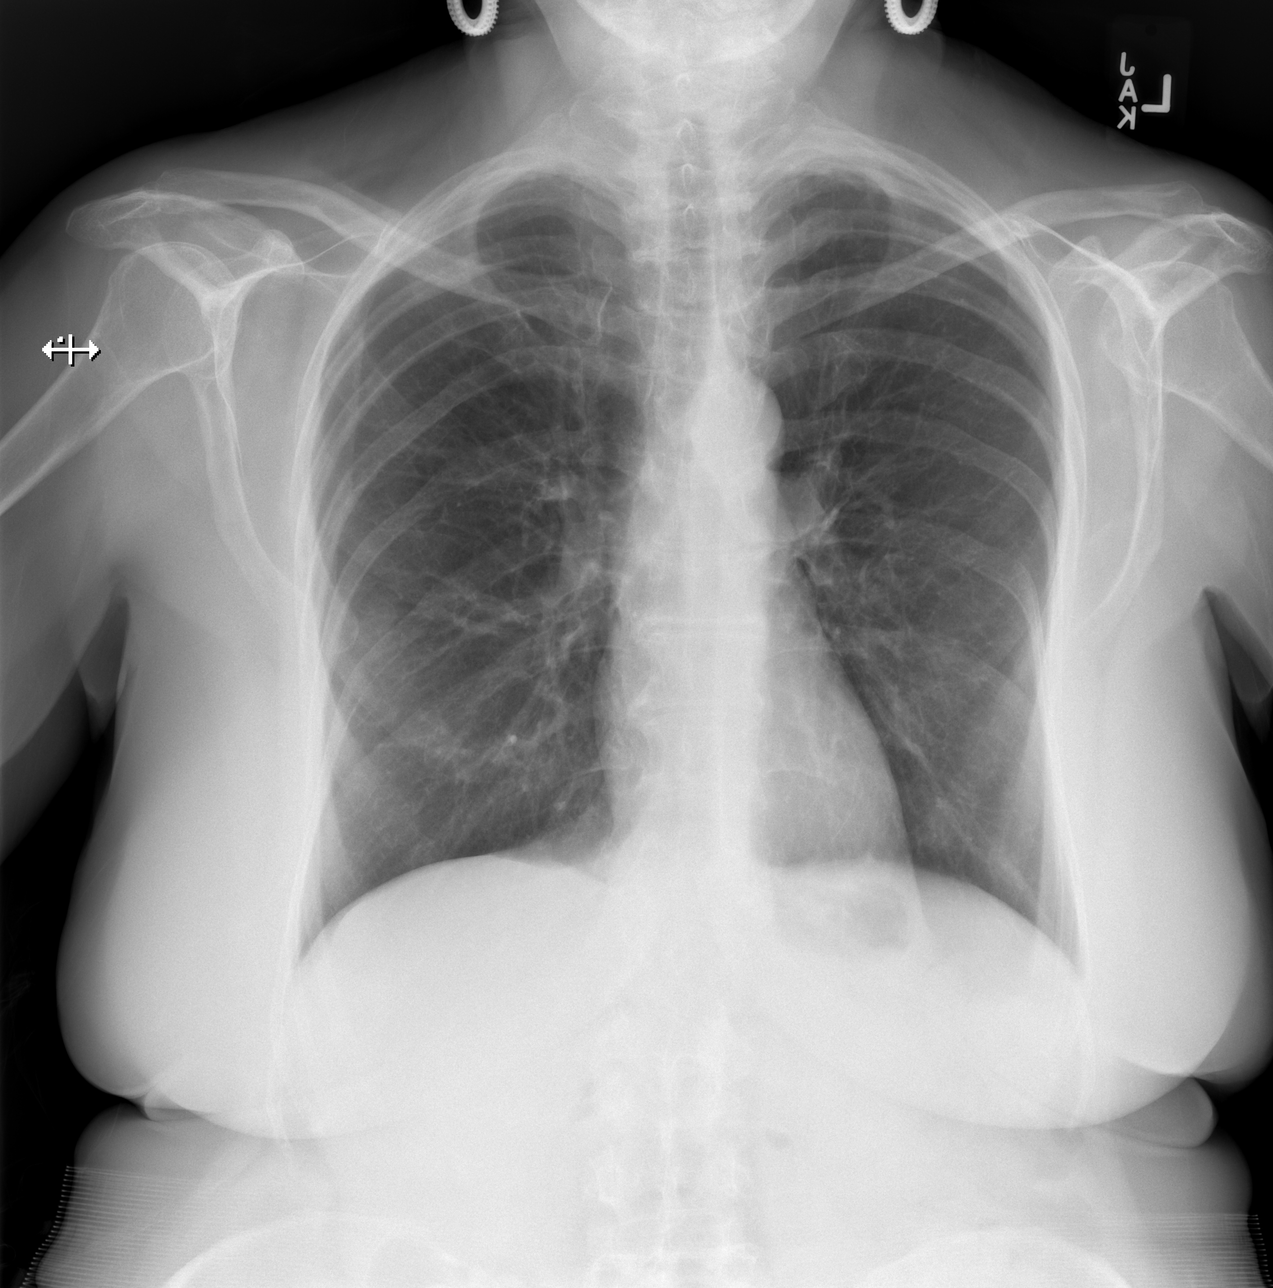

[w abdomen upright]
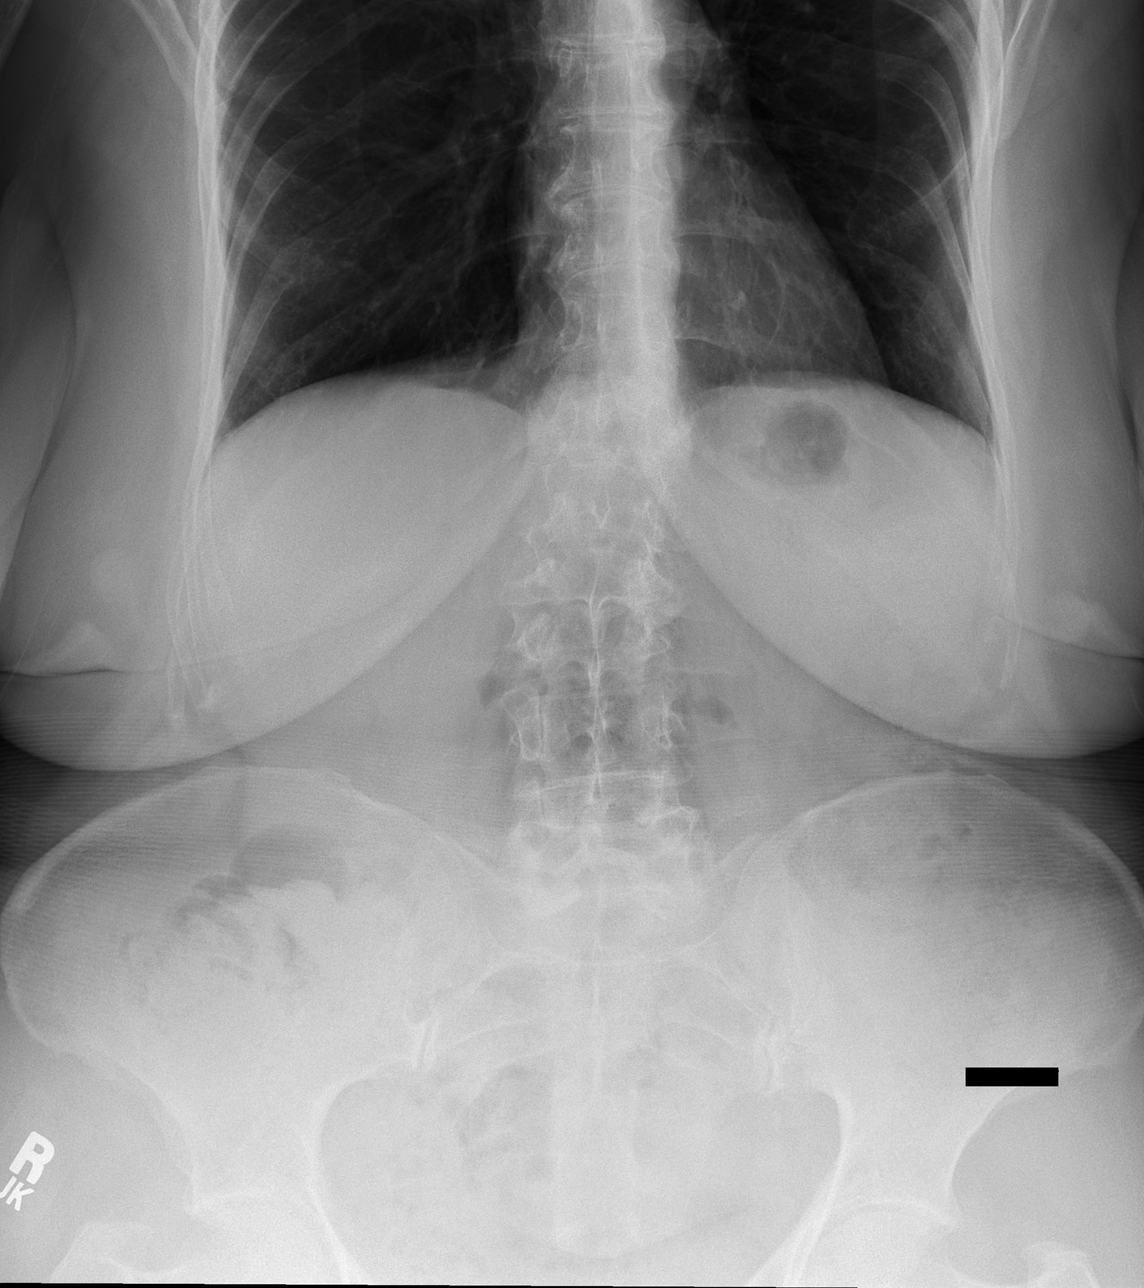

[t abdomen supine]
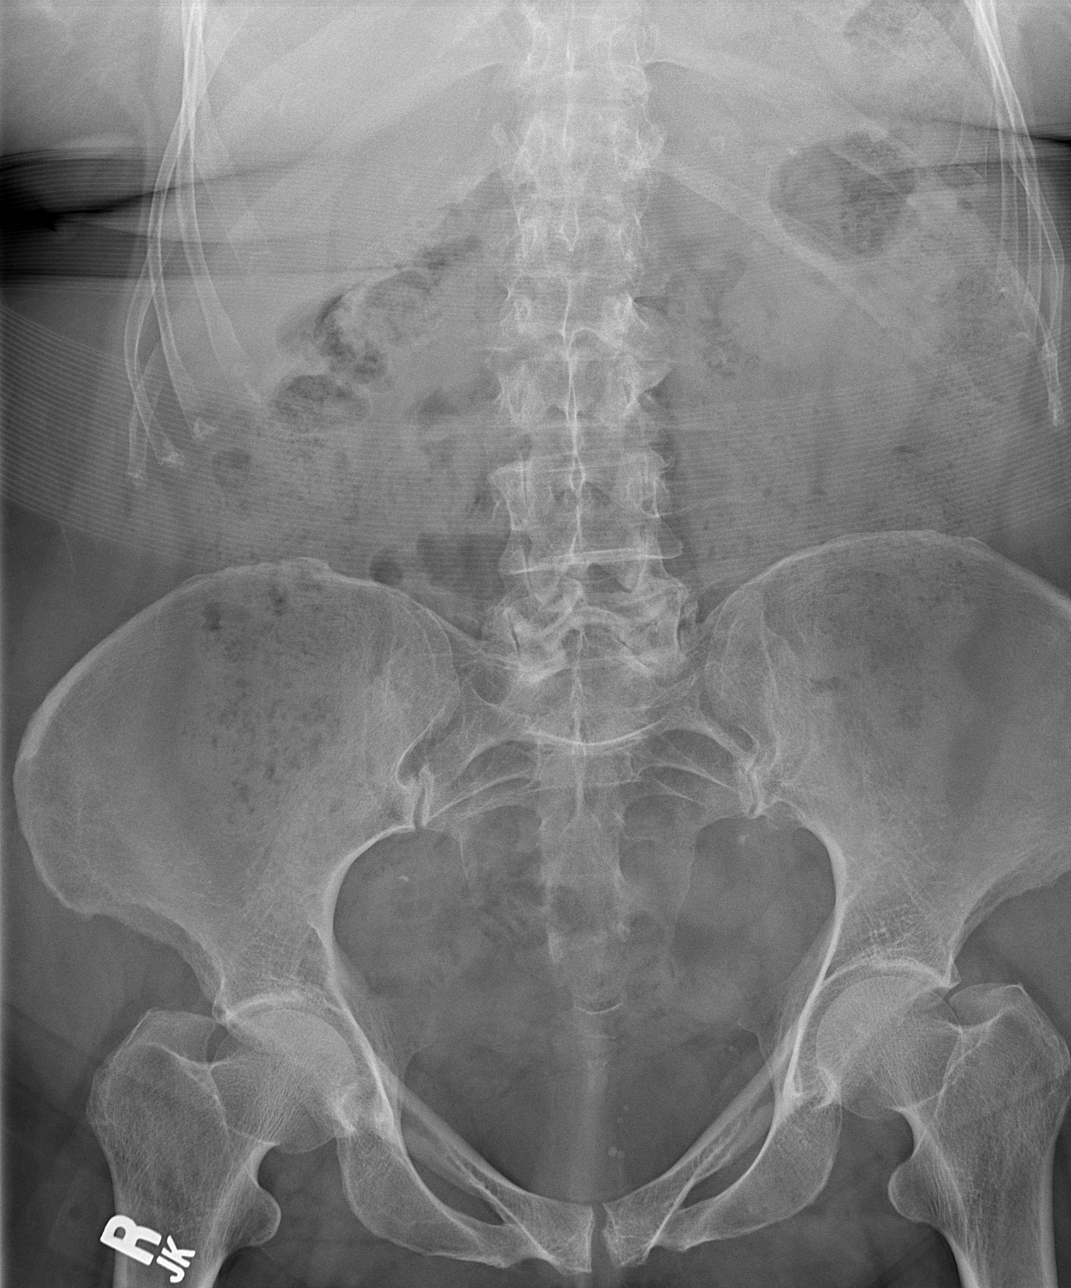

[3 of 3 positions shown; findings below may reference images not displayed]

FINDINGS: Normal sized heart. Clear lungs with normal vascularity. The lungs
are mildly hyperexpanded with mild biapical pleural and parenchymal
scarring.

Normal bowel gas pattern without free peritoneal air. Prominent
stool in the right, transverse and left colon. Lumbar and lower
thoracic spine degenerative changes and mild scoliosis.
IMPRESSION: 1. No acute abnormality.
2. Prominent stool.
3. Mild changes of COPD.

## 2022-08-12 ENCOUNTER — Other Ambulatory Visit: Payer: Self-pay | Admitting: Family Medicine

## 2022-08-12 ENCOUNTER — Ambulatory Visit
Admission: RE | Admit: 2022-08-12 | Discharge: 2022-08-12 | Disposition: A | Discharge status: Home / Self Care | Payer: Medicare Other | Source: Ambulatory Visit | Attending: Family Medicine | Admitting: Family Medicine

## 2022-08-12 DIAGNOSIS — M5414 Radiculopathy, thoracic region: Secondary | ICD-10-CM

## 2023-12-22 ENCOUNTER — Ambulatory Visit: Payer: Self-pay | Admitting: Surgery

## 2024-01-01 ENCOUNTER — Encounter (HOSPITAL_COMMUNITY): Payer: Self-pay

## 2024-01-01 NOTE — Progress Notes (Signed)
 Date of COVID positive in last 90 days:  PCP - Darice Henle, MD Cardiologist -   Chest x-ray -  EKG -  Stress Test -  ECHO -  Cardiac Cath -  Pacemaker/ICD device last checked: Spinal Cord Stimulator:  Bowel Prep -   Sleep Study -  CPAP -   Prediabetes Fasting Blood Sugar -  Checks Blood Sugar _____ times a day  Last dose of GLP1 agonist-  N/A GLP1 instructions:  Do not take after     Last dose of SGLT-2 inhibitors-  N/A SGLT-2 instructions:  Do not take after     Blood Thinner Instructions:  Last dose:   Time: Aspirin Instructions: Last Dose:  Activity level:  Can go up a flight of stairs and perform activities of daily living without stopping and without symptoms of chest pain or shortness of breath.  Able to exercise without symptoms  Unable to go up a flight of stairs without symptoms of     Anesthesia review: Mild COPD  Patient denies shortness of breath, fever, cough and chest pain at PAT appointment  Patient verbalized understanding of instructions that were given to them at the PAT appointment. Patient was also instructed that they will need to review over the PAT instructions again at home before surgery.

## 2024-01-01 NOTE — Patient Instructions (Signed)
 SURGICAL WAITING ROOM VISITATION Patients having surgery or a procedure may have no more than 2 support people in the waiting area - these visitors may rotate.    Children under the age of 45 must have an adult with them who is not the patient.  If the patient needs to stay at the hospital during part of their recovery, the visitor guidelines for inpatient rooms apply. Pre-op nurse will coordinate an appropriate time for 1 support person to accompany patient in pre-op.  This support person may not rotate.    Please refer to the [facility] website for the visitor guidelines for Inpatients (after your surgery is over and you are in a regular room).       Your procedure is scheduled on: 01-08-24   Report to [facility] Main Entrance    Report to admitting at 5:15 AM   Call this number if you have problems the morning of surgery [phone]   Do not eat food :After Midnight.   After Midnight you may have the following liquids until 4:30 AM DAY OF SURGERY  Water Non-Citrus Juices (without pulp, NO RED-Apple, White grape, White cranberry) Black Coffee (NO MILK/CREAM OR CREAMERS, sugar ok)  Clear Tea (NO MILK/CREAM OR CREAMERS, sugar ok) regular and decaf                             Plain Jell-O (NO RED)                                           Fruit ices (not with fruit pulp, NO RED)                                     Popsicles (NO RED)                                                               Sports drinks like Gatorade (NO RED)                     If you have questions, please contact your surgeon's office.   FOLLOW ANY ADDITIONAL PRE OP INSTRUCTIONS YOU RECEIVED FROM YOUR SURGEON'S OFFICE!!!     Oral Hygiene is also important to reduce your risk of infection.                                    Remember - BRUSH YOUR TEETH THE MORNING OF SURGERY WITH YOUR REGULAR TOOTHPASTE   Do NOT smoke after Midnight   Take these medicines the morning of surgery with A SIP OF  WATER:    Bupropion   Hydroxyzine   Pantoprazole    Okay to use eyedrops   Tramadol  if needed  Stop all vitamins and herbal supplements 7 days before surgery  Bring CPAP mask and tubing day of surgery.  You may not have any metal on your body including hair pins, jewelry, and body piercing             Do not wear make-up, lotions, powders, perfumes or deodorant  Do not wear nail polish including gel and S&S, artificial/acrylic nails, or any other type of covering on natural nails including finger and toenails. If you have artificial nails, gel coating, etc. that needs to be removed by a nail salon please have this removed prior to surgery or surgery may need to be canceled/ delayed if the surgeon/ anesthesia feels like they are unable to be safely monitored.   Do not shave  48 hours prior to surgery.    Do not bring valuables to the hospital. [facility] IS NOT RESPONSIBLE   FOR VALUABLES.   Contacts, dentures or bridgework may not be worn into surgery.  DO NOT BRING YOUR HOME MEDICATIONS TO THE HOSPITAL. PHARMACY WILL DISPENSE MEDICATIONS LISTED ON YOUR MEDICATION LIST TO YOU DURING YOUR ADMISSION IN THE HOSPITAL!    Patients discharged on the day of surgery will not be allowed to drive home.  Someone NEEDS to stay with you for the first 24 hours after anesthesia.   Special Instructions: Bring a copy of your healthcare power of attorney and living will documents the day of surgery if you haven't scanned them before.              Please read over the following fact sheets you were given: IF YOU HAVE QUESTIONS ABOUT YOUR PRE-OP INSTRUCTIONS PLEASE CALL [phone] [place]  If you received a COVID test during your pre-op visit  it is requested that you wear a mask when out in public, stay away from anyone that may not be feeling well and notify your surgeon if you develop symptoms. If you test positive for Covid or have been in contact with anyone that has  tested positive in the last 10 days please notify you surgeon.  [facility] - Preparing for Surgery Before surgery, you can play an important role.  Because skin is not sterile, your skin needs to be as free of germs as possible.  You can reduce the number of germs on your skin by washing with CHG (chlorahexidine gluconate) soap before surgery.  CHG is an antiseptic cleaner which kills germs and bonds with the skin to continue killing germs even after washing. Please DO NOT use if you have an allergy to CHG or antibacterial soaps.  If your skin becomes reddened/irritated stop using the CHG and inform your nurse when you arrive at Short Stay. Do not shave (including legs and underarms) for at least 48 hours prior to the first CHG shower.  You may shave your face/neck.  Please follow these instructions carefully:  1.  Shower with CHG Soap the night before surgery and the  morning of surgery.  2.  If you choose to wash your hair, wash your hair first as usual with your normal  shampoo.  3.  After you shampoo, rinse your hair and body thoroughly to remove the shampoo.                             4.  Use CHG as you would any other liquid soap.  You can apply chg directly to the skin and wash.  Gently with a scrungie or clean washcloth.  5.  Apply the CHG Soap to your body ONLY FROM THE  NECK DOWN.   Do   not use on face/ open                           Wound or open sores. Avoid contact with eyes, ears mouth and   genitals (private parts).                       Wash face,  Genitals (private parts) with your normal soap.             6.  Wash thoroughly, paying special attention to the area where your    surgery  will be performed.  7.  Thoroughly rinse your body with warm water from the neck down.  8.  DO NOT shower/wash with your normal soap after using and rinsing off the CHG Soap.                9.  Pat yourself dry with a clean towel.            10.  Wear clean pajamas.            11.  Place clean  sheets on your bed the night of your first shower and do not  sleep with pets. Day of Surgery : Do not apply any lotions/deodorants the morning of surgery.  Please wear clean clothes to the hospital/surgery center.  FAILURE TO FOLLOW THESE INSTRUCTIONS MAY RESULT IN THE CANCELLATION OF YOUR SURGERY  PATIENT SIGNATURE_________________________________  NURSE SIGNATURE__________________________________  ________________________________________________________________________

## 2024-01-03 ENCOUNTER — Encounter: Payer: Self-pay | Admitting: Surgery

## 2024-01-03 DIAGNOSIS — M7989 Other specified soft tissue disorders: Secondary | ICD-10-CM | POA: Diagnosis present

## 2024-01-03 NOTE — H&P (Signed)
 REFERRING PHYSICIAN: [name], MD  PROVIDER: Maudine Kluesner Emma SPINNER, MD   Chief Complaint: New Consultation (Multiple lipoma)  History of Present Illness:  Patient is referred by her dermatologist, Dr. Mallick Emma, for surgical evaluation and management of multiple soft tissue masses involving the right upper extremity and right lower extremity. Patient states that these have been present for a number of years. They have increased in size and they are causing her discomfort. She desires surgical excision. Patient has not had prior such lesions removed.  Review of Systems: A complete review of systems was obtained from the patient. I have reviewed this information and discussed as appropriate with the patient. See HPI as well for other ROS.  Review of Systems  Constitutional: Negative.  HENT: Negative.  Eyes: Negative.  Respiratory: Negative.  Cardiovascular: Negative.  Gastrointestinal: Negative.  Genitourinary: Negative.  Musculoskeletal: Negative.  Skin:  Multiple soft tissue masses  Neurological: Negative.  Endo/Heme/Allergies: Negative.  Psychiatric/Behavioral: Negative.    Medical History: Past Medical History:  Diagnosis Date  Anxiety  Asthma, unspecified asthma severity, unspecified whether complicated, unspecified whether persistent (HHS-HCC)  COPD (chronic obstructive pulmonary disease) (CMS/HHS-HCC)  Glaucoma (increased eye pressure)   Patient Active Problem List  Diagnosis  Acquired hammer toe of right foot  Age-related osteoporosis without current pathological fracture  Depression  Osteoarthritis of lumbar spine  Fatigue  Fibromyalgia  Hallux rigidus, right foot  Obesity  Osteopenia  Polyarthralgia  Posterior tibial tendinitis, left leg  Primary osteoarthritis of both feet  Primary osteoarthritis of both hands  Primary osteoarthritis of knee  Mass of soft tissue of right lower extremity  Mass of soft tissue of right upper extremity    Past Surgical History:  Procedure Laterality Date  ANOSCOPY    Allergies  Allergen Reactions  Penicillins Rash   Current Outpatient Medications on File Prior to Visit  Medication Sig Dispense Refill  ALPRAZolam (XANAX) 0.25 MG tablet  azelastine-fluticasone  137-50 mcg/spray Spry 1 puff in each nostril Nasally Twice a day  diazePAM (VALIUM) 2 MG tablet TAKE 1 TABLET BY MOUTH THREE TIMES DAILY AS NEEDED FOR VERTIGO  dicyclomine (BENTYL) 10 mg capsule TAKE 1 TO 2 CAPSULES BY MOUTH THREE TIMES DAILY AS NEEDED  dorzolamide-timolol (COSOPT PF) 2-0.5 % ophthalmic solution Place 1 drop into both eyes 2 (two) times daily  EPINEPHrine  (EPIPEN ) 0.3 mg/0.3 mL auto-injector Inject 0.3 mg into the muscle  losartan-hydroCHLOROthiazide (HYZAAR) 100-12.5 mg tablet Take 1 tablet by mouth once daily  montelukast  (SINGULAIR ) 10 mg tablet  naltrexone (REVIA) 50 mg tablet Take 25 mg by mouth once daily  sucralfate  (CARAFATE ) 1 gram tablet  timoloL maleate (TIMOPTIC) 0.5 % ophthalmic solution 1 drop into affected eye Ophthalmic Once a day  traZODone (DESYREL) 50 MG tablet TAKE 1/2 TO 1 TABLET BY MOUTH 30 MINUTES BEFORE BEDTIME FOR SLEEP   No current facility-administered medications on file prior to visit.   Family History  Problem Relation Age of Onset  Stroke Mother  Obesity Sister  High blood pressure (Hypertension) Sister  Hyperlipidemia (Elevated cholesterol) Sister  Coronary Artery Disease (Blocked arteries around heart) Sister    Social History   Tobacco Use  Smoking Status Never  Smokeless Tobacco Never    Social History   Socioeconomic History  Marital status: Married  Tobacco Use  Smoking status: Never  Smokeless tobacco: Never  Substance and Sexual Activity  Alcohol use: Never  Drug use: Never   Social Drivers of Health   Housing Stability: Unknown (  12/22/2023)  Housing Stability Vital Sign  Homeless in the Last Year: No   Objective:   Vitals:  BP: (!) 148/70   Pulse: 70  Temp: 36.3 C (97.3 F)  SpO2: 96%  Weight: 79.9 kg (176 lb 3.2 oz)  Height: 157.5 cm (5' 2)  PainSc: 0-No pain  PainLoc: Leg   Body mass index is 32.23 kg/m.  Physical Exam   GENERAL APPEARANCE Comfortable, no acute issues Development: normal Gross deformities: none  SKIN Rash, lesions, ulcers: none Induration, erythema: none Nodules: Or is a palpable mass in the lateral right upper arm above the level of the elbow measuring approximately 2 cm in diameter, discrete, relatively firm, and mildly tender consistent with a lipoma. There is a larger mass in the right lateral mid thigh measuring 3 x 5 cm in size, mobile, mildly tender, and well-defined. There is another mass just slightly towards the right knee measuring approximately 2 x 2 cm, again mildly tender, mobile, and consistent with a lipoma.  EYES Conjunctiva and lids: normal Pupils: equal  EARS, NOSE, MOUTH, THROAT External ears: no lesion or deformity External nose: no lesion or deformity Hearing: grossly normal  CHEST/CV Not assessed  ABDOMEN Not assessed  GENITOURINARY/RECTAL Not assessed  MUSCULOSKELETAL Station and gait: normal Digits and nails: no clubbing or cyanosis Muscle strength: grossly normal all extremities Deformity: none  PSYCHIATRIC Oriented to person, place, and time: yes Mood and affect: normal for situation Judgment and insight: appropriate for situation   Assessment and Plan:   Mass of soft tissue of right lower extremity Mass of soft tissue of right upper extremity  Patient is referred by her dermatologist, Dr. Alyce [name], for surgical evaluation and management of multiple soft tissue masses in the right upper extremity and right lower extremity which are symptomatic. On examination, these most likely represent lipomas. They are tender to palpation. By history they have increased in size. I have recommended proceeding with surgical excision under sedation and local  anesthesia. This could be done as an outpatient procedure. We discussed the size and location of the surgical incisions. We discussed the postoperative recovery. The patient understands and wishes to proceed with surgery in the near future.  Emma Spinner, MD [facility] practice Office: [phone]

## 2024-01-05 ENCOUNTER — Encounter (HOSPITAL_COMMUNITY): Payer: Self-pay

## 2024-01-05 ENCOUNTER — Other Ambulatory Visit: Payer: Self-pay

## 2024-01-05 ENCOUNTER — Encounter (HOSPITAL_COMMUNITY)
Admission: RE | Admit: 2024-01-05 | Discharge: 2024-01-05 | Disposition: A | Discharge status: Home / Self Care | Source: Ambulatory Visit | Attending: Surgery | Admitting: Surgery

## 2024-01-05 VITALS — BP 135/81 | HR 57 | Temp 98.8°F | Resp 16 | Ht 62.0 in | Wt 176.0 lb

## 2024-01-05 DIAGNOSIS — I10 Essential (primary) hypertension: Secondary | ICD-10-CM | POA: Diagnosis not present

## 2024-01-05 DIAGNOSIS — M797 Fibromyalgia: Secondary | ICD-10-CM | POA: Insufficient documentation

## 2024-01-05 DIAGNOSIS — D649 Anemia, unspecified: Secondary | ICD-10-CM | POA: Diagnosis not present

## 2024-01-05 DIAGNOSIS — J449 Chronic obstructive pulmonary disease, unspecified: Secondary | ICD-10-CM | POA: Insufficient documentation

## 2024-01-05 DIAGNOSIS — Z79899 Other long term (current) drug therapy: Secondary | ICD-10-CM | POA: Diagnosis not present

## 2024-01-05 DIAGNOSIS — M7989 Other specified soft tissue disorders: Secondary | ICD-10-CM | POA: Diagnosis not present

## 2024-01-05 DIAGNOSIS — F4310 Post-traumatic stress disorder, unspecified: Secondary | ICD-10-CM | POA: Insufficient documentation

## 2024-01-05 DIAGNOSIS — Z9889 Other specified postprocedural states: Secondary | ICD-10-CM | POA: Insufficient documentation

## 2024-01-05 DIAGNOSIS — Z01818 Encounter for other preprocedural examination: Secondary | ICD-10-CM | POA: Insufficient documentation

## 2024-01-05 DIAGNOSIS — Z01810 Encounter for preprocedural cardiovascular examination: Secondary | ICD-10-CM | POA: Diagnosis present

## 2024-01-05 DIAGNOSIS — Z01812 Encounter for preprocedural laboratory examination: Secondary | ICD-10-CM | POA: Diagnosis present

## 2024-01-05 HISTORY — DX: Anemia, unspecified: D64.9

## 2024-01-05 HISTORY — DX: Unspecified malignant neoplasm of skin, unspecified: C44.90

## 2024-01-05 HISTORY — DX: Essential (primary) hypertension: I10

## 2024-01-05 HISTORY — DX: Dyspnea, unspecified: R06.00

## 2024-01-05 LAB — BASIC METABOLIC PANEL WITH GFR
Anion gap: 11 (ref 5–15)
BUN: 18 mg/dL (ref 8–23)
CO2: 24 mmol/L (ref 22–32)
Calcium: 9.2 mg/dL (ref 8.9–10.3)
Chloride: 100 mmol/L (ref 98–111)
Creatinine, Ser: 0.86 mg/dL (ref 0.44–1.00)
GFR, Estimated: >60 mL/min (ref >60)
Glucose, Bld: 107 mg/dL — ABNORMAL HIGH (ref 70–99)
Potassium: 3.9 mmol/L (ref 3.5–5.1)
Sodium: 135 mmol/L (ref 135–145)

## 2024-01-05 LAB — CBC
HCT: 39.1 % (ref 36.0–46.0)
Hemoglobin: 13.1 g/dL (ref 12.0–15.0)
MCH: 31.7 pg (ref 26.0–34.0)
MCHC: 33.5 g/dL (ref 30.0–36.0)
MCV: 94.7 fL (ref 80.0–100.0)
Platelets: 284 10*3/uL (ref 150–400)
RBC: 4.13 MIL/uL (ref 3.87–5.11)
RDW: 13.2 % (ref 11.5–15.5)
WBC: 4.3 10*3/uL (ref 4.0–10.5)
nRBC: 0 % (ref 0.0–0.2)

## 2024-01-06 NOTE — Progress Notes (Signed)
 Anesthesia Chart Review   Case: [id number] Date/Time: 01/08/24 0715   Procedures:      EXCISION, MASS, UPPER EXTREMITY (Right) - RIGHT LOCAL & MAC     EXCISION MASS LOWER EXTREMITIES (Right) - RIGHT LOCAL & MAC   Anesthesia type: Monitor Anesthesia Care   Diagnosis:      Mass of soft tissue of right lower extremity [M79.89]     Mass of soft tissue of right upper extremity [M79.89]   Pre-op diagnosis: MULTIPLE SOFT TISSUE MASSES OF EXTREMITIES   Location: [facility] ROOM 01 / [facility]   Surgeons: Eletha Boas, MD       DISCUSSION:73 y.o. never smoker with h/o PONV, fibromyalgia, PTSD, COPD, HTN, multiple soft tissues masses of extremities scheduled for above procedure 01/08/2024 with Dr. Boas Eletha.   Pt with COPD, reports no recent flare ups.  She is on trelegy daily.   Pt last seen by PCP 12/08/2023, stable at this visit.  Pt reports she can climb a flight of stairs without chest pain or shortness of breath.  EKG at PAT with bradycardia 56 bpm, no acute findings. Bradycardia chronic, asymptomatic.  VS: BP 135/81   Pulse (!) 57   Temp 37.1 C (Oral)   Resp 16   Ht 5' 2 (1.575 m)   Wt 79.8 kg   SpO2 97%   BMI 32.19 kg/m   PROVIDERS: Burney Darice CROME, MD is PCP    LABS: Labs reviewed: Acceptable for surgery. (all labs ordered are listed, but only abnormal results are displayed)  Labs Reviewed  BASIC METABOLIC PANEL WITH GFR - Abnormal; Notable for the following components:      Result Value   Glucose, Bld 107 (*)    All other components within normal limits  CBC     IMAGES:   EKG:   CV:  Past Medical History:  Diagnosis Date   AIN grade I    Anemia    COPD, mild (HCC)    11-07-2020  per pt told due to scarring from asthma as child,  asymptomatic   DDD (degenerative disc disease), cervical    DDD (degenerative disc disease), lumbosacral    Depression    Diverticulosis of colon    Dyspnea    Family history of adverse reaction to anesthesia    mom wakes up  fighting and sick   Fibromyalgia    followed by pcp   GAD (generalized anxiety disorder)    GERD (gastroesophageal reflux disease)    Glaucoma, both eyes    History of asthma    childhood   History of concussion    per pt approx. 2010, fell downstairs,  stated no residual   History of panic attacks    Hypertension    Insomnia    Interstitial cystitis    11-07-2020  per pt has been in remission for several yrs   Irritable bowel syndrome with constipation    followed by dr shila   Osteoarthritis    knees, hands, back   Osteopenia    Osteoporosis    PONV (postoperative nausea and vomiting)    Pre-diabetes    followed by pcp   PTSD (post-traumatic stress disorder)    Skin cancer    Ear   Trigeminal neuralgia    followed by pcp---  takes gabapentin    Urinary frequency    Urinary urgency     Past Surgical History:  Procedure Laterality Date   CATARACT EXTRACTION W/ INTRAOCULAR LENS IMPLANT Right 10/04/2020  COLONOSCOPY WITH ESOPHAGOGASTRODUODENOSCOPY (EGD)  last one 08-03-2020  dr shila   FLAT FOOT CORRECTION Right 05/2009   HIGH RESOLUTION ANOSCOPY N/A 11/09/2020   Procedure: HIGH RESOLUTION ANOSCOPY WITH EXCISION OF ANAL LESION;  Surgeon: Debby Hila, MD;  [facility];  Service: General;  Laterality: N/A;   TOTAL KNEE ARTHROPLASTY Right 05/08/2015   Procedure: TOTAL KNEE ARTHROPLASTY;  Surgeon: Maude LELON Right, MD;  Location[facility];  Service: Orthopedics;  Laterality: Right;   VAGINAL HYSTERECTOMY  12-01-2005  @[facility]    W/  BILATERAL SALPINGOOPHECTOMY,  ANTERIOR & POSTERIOR REPAIR,  CULDOPLASTY,  AND TVT SLING    MEDICATIONS:  Acetylcysteine (NAC 600 PO)   Apoaequorin (PREVAGEN PO)   Ascorbic Acid (VITAMIN C PO)   CALCIUM  CITRATE PO   diazepam (VALIUM) 2 MG tablet   dicyclomine (BENTYL) 10 MG capsule   Dorzolamide HCl-Timolol Mal PF 2-0.5 % SOLN   EPINEPHrine  (EPI-PEN) 0.3 mg/0.3 mL DEVI   Fluticasone -Umeclidin-Vilant (TRELEGY  ELLIPTA) 100-62.5-25 MCG/ACT AEPB   hydrOXYzine (ATARAX/VISTARIL) 10 MG tablet   KLAYESTA powder   losartan-hydrochlorothiazide (HYZAAR) 100-12.5 MG tablet   MAGNESIUM  COMPLEX HIGH POTENCY PO   montelukast  (SINGULAIR ) 10 MG tablet   OVER THE COUNTER MEDICATION   sucralfate  (CARAFATE ) 1 g tablet   Travoprost, BAK Free, (TRAVATAN) 0.004 % SOLN ophthalmic solution   TURMERIC PO   Vitamin D -Vitamin K (D3 + K2 PO)   Zinc Sulfate (ZINC-220 PO)   No current facility-administered medications for this encounter.    Harlene Hoots Ward, PA-C [facility] [phone]

## 2024-01-06 NOTE — Progress Notes (Signed)
 Patient phoned to inform that she had been diagnosed with ringworm and started on an antifungal 01-05-24.  Patient was advised to call Dr. Ronold office to make sure that this is not a problem for her surgery, patient stated understanding.

## 2024-01-07 NOTE — Anesthesia Preprocedure Evaluation (Addendum)
 Anesthesia Evaluation  Patient identified by MRN, date of birth, ID band Patient awake    Reviewed: Allergy & Precautions, NPO status , Patient's Chart, lab work & pertinent test results  History of Anesthesia Complications (+) PONV, Family history of anesthesia reaction and history of anesthetic complications  Airway Mallampati: I  TM Distance: >3 FB Neck ROM: Full    Dental  (+) Dental Advisory Given,    Pulmonary neg shortness of breath, asthma , neg sleep apnea, COPD (used inhaler yesterday),  COPD inhaler, neg recent URI   Pulmonary exam normal breath sounds clear to auscultation       Cardiovascular hypertension (losartan-HCTZ), Pt. on medications (-) angina (-) Past MI, (-) Cardiac Stents and (-) CABG (-) dysrhythmias  Rhythm:Regular Rate:Normal     Neuro/Psych neg Seizures PSYCHIATRIC DISORDERS (PTSD, panic attacks) Anxiety Depression     Neuromuscular disease (trigeminal neuralgia)    GI/Hepatic Neg liver ROS,GERD  ,,IBS, diverticulosis    Endo/Other  Pre-diabetes  Renal/GU negative Renal ROS     Musculoskeletal  (+) Arthritis , Osteoarthritis,  Fibromyalgia -Osteoporosis    Abdominal  (+) + obese  Peds  Hematology negative hematology ROS (+) Lab Results      Component                Value               Date                      WBC                      4.3                 01/05/2024                HGB                      13.1                01/05/2024                HCT                      39.1                01/05/2024                MCV                      94.7                01/05/2024                PLT                      284                 01/05/2024              Anesthesia Other Findings   Reproductive/Obstetrics                             Anesthesia Physical Anesthesia Plan  ASA: 3  Anesthesia Plan: MAC   Post-op Pain Management: Minimal or no pain  anticipated and Tylenol  PO (pre-op)*   Induction: Intravenous  PONV Risk  Score and Plan: 3 and Ondansetron , Dexamethasone, Propofol  infusion, TIVA and Treatment may vary due to age or medical condition  Airway Management Planned: Natural Airway and Simple Face Mask  Additional Equipment:   Intra-op Plan:   Post-operative Plan:   Informed Consent: I have reviewed the patients History and Physical, chart, labs and discussed the procedure including the risks, benefits and alternatives for the proposed anesthesia with the patient or authorized representative who has indicated his/her understanding and acceptance.     Dental advisory given  Plan Discussed with: CRNA and Anesthesiologist  Anesthesia Plan Comments: (Discussed with patient risks of MAC including, but not limited to, minor pain or discomfort, hearing people in the room, and possible need for backup general anesthesia. Risks for general anesthesia also discussed including, but not limited to, sore throat, hoarse voice, chipped/damaged teeth, injury to vocal cords, nausea and vomiting, allergic reactions, lung infection, heart attack, stroke, and death. All questions answered. )        Anesthesia Quick Evaluation

## 2024-01-08 ENCOUNTER — Encounter (HOSPITAL_COMMUNITY): Payer: Self-pay | Admitting: Surgery

## 2024-01-08 ENCOUNTER — Ambulatory Visit (HOSPITAL_COMMUNITY): Payer: Self-pay | Admitting: Physician Assistant

## 2024-01-08 ENCOUNTER — Other Ambulatory Visit: Payer: Self-pay

## 2024-01-08 ENCOUNTER — Ambulatory Visit (HOSPITAL_COMMUNITY)
Admission: RE | Admit: 2024-01-08 | Discharge: 2024-01-08 | Disposition: A | Discharge status: Home / Self Care | Attending: Surgery | Admitting: Surgery

## 2024-01-08 ENCOUNTER — Ambulatory Visit (HOSPITAL_BASED_OUTPATIENT_CLINIC_OR_DEPARTMENT_OTHER): Payer: Self-pay | Admitting: Anesthesiology

## 2024-01-08 ENCOUNTER — Encounter (HOSPITAL_COMMUNITY)
Admission: RE | Disposition: A | Discharge status: Home / Self Care | Payer: Self-pay | Source: Home / Self Care | Attending: Surgery

## 2024-01-08 DIAGNOSIS — D1723 Benign lipomatous neoplasm of skin and subcutaneous tissue of right leg: Secondary | ICD-10-CM | POA: Diagnosis not present

## 2024-01-08 DIAGNOSIS — M7989 Other specified soft tissue disorders: Secondary | ICD-10-CM

## 2024-01-08 DIAGNOSIS — I10 Essential (primary) hypertension: Secondary | ICD-10-CM | POA: Diagnosis not present

## 2024-01-08 DIAGNOSIS — M810 Age-related osteoporosis without current pathological fracture: Secondary | ICD-10-CM | POA: Diagnosis not present

## 2024-01-08 DIAGNOSIS — M1990 Unspecified osteoarthritis, unspecified site: Secondary | ICD-10-CM | POA: Diagnosis not present

## 2024-01-08 DIAGNOSIS — K5790 Diverticulosis of intestine, part unspecified, without perforation or abscess without bleeding: Secondary | ICD-10-CM | POA: Diagnosis not present

## 2024-01-08 DIAGNOSIS — G500 Trigeminal neuralgia: Secondary | ICD-10-CM | POA: Diagnosis not present

## 2024-01-08 DIAGNOSIS — D1721 Benign lipomatous neoplasm of skin and subcutaneous tissue of right arm: Secondary | ICD-10-CM | POA: Diagnosis not present

## 2024-01-08 DIAGNOSIS — M797 Fibromyalgia: Secondary | ICD-10-CM | POA: Insufficient documentation

## 2024-01-08 DIAGNOSIS — F419 Anxiety disorder, unspecified: Secondary | ICD-10-CM | POA: Insufficient documentation

## 2024-01-08 DIAGNOSIS — Z6832 Body mass index (BMI) 32.0-32.9, adult: Secondary | ICD-10-CM | POA: Insufficient documentation

## 2024-01-08 DIAGNOSIS — E669 Obesity, unspecified: Secondary | ICD-10-CM | POA: Insufficient documentation

## 2024-01-08 DIAGNOSIS — F418 Other specified anxiety disorders: Secondary | ICD-10-CM | POA: Diagnosis not present

## 2024-01-08 DIAGNOSIS — F4310 Post-traumatic stress disorder, unspecified: Secondary | ICD-10-CM | POA: Diagnosis not present

## 2024-01-08 DIAGNOSIS — J449 Chronic obstructive pulmonary disease, unspecified: Secondary | ICD-10-CM | POA: Diagnosis not present

## 2024-01-08 DIAGNOSIS — K589 Irritable bowel syndrome without diarrhea: Secondary | ICD-10-CM | POA: Diagnosis not present

## 2024-01-08 DIAGNOSIS — F410 Panic disorder [episodic paroxysmal anxiety] without agoraphobia: Secondary | ICD-10-CM | POA: Diagnosis not present

## 2024-01-08 DIAGNOSIS — R7303 Prediabetes: Secondary | ICD-10-CM | POA: Insufficient documentation

## 2024-01-08 DIAGNOSIS — Z79899 Other long term (current) drug therapy: Secondary | ICD-10-CM | POA: Insufficient documentation

## 2024-01-08 DIAGNOSIS — K219 Gastro-esophageal reflux disease without esophagitis: Secondary | ICD-10-CM | POA: Insufficient documentation

## 2024-01-08 DIAGNOSIS — F32A Depression, unspecified: Secondary | ICD-10-CM | POA: Insufficient documentation

## 2024-01-08 HISTORY — PX: EXCISION, MASS, UPPER EXTREMITY: SHX7567

## 2024-01-08 HISTORY — PX: EXCISION MASS LOWER EXTREMETIES: SHX6705

## 2024-01-08 SURGERY — EXCISION, MASS, UPPER EXTREMITY
Anesthesia: Monitor Anesthesia Care | Laterality: Right

## 2024-01-08 MED ORDER — LACTATED RINGERS IV SOLN: INTRAVENOUS | Status: DC

## 2024-01-08 MED ORDER — OXYCODONE HCL 5 MG PO TABS: 5.0000 mg | ORAL_TABLET | Freq: Once | ORAL | Status: DC | PRN

## 2024-01-08 MED ORDER — PROPOFOL 500 MG/50ML IV EMUL
INTRAVENOUS | Status: DC | PRN
  Administered 2024-01-08: 130 ug/kg/min via INTRAVENOUS

## 2024-01-08 MED ORDER — EPHEDRINE 5 MG/ML INJ
INTRAVENOUS | Status: AC
  Filled 2024-01-08: qty 5

## 2024-01-08 MED ORDER — BUPIVACAINE-EPINEPHRINE (PF) 0.5% -1:200000 IJ SOLN
INTRAMUSCULAR | Status: AC
  Filled 2024-01-08: qty 30

## 2024-01-08 MED ORDER — CHLORHEXIDINE GLUCONATE 0.12 % MT SOLN
15.0000 mL | Freq: Once | OROMUCOSAL | Status: AC
  Administered 2024-01-08: 15 mL via OROMUCOSAL

## 2024-01-08 MED ORDER — ONDANSETRON HCL 4 MG/2ML IJ SOLN
INTRAMUSCULAR | Status: DC | PRN
Start: 2024-01-08 — End: 2024-01-08
  Administered 2024-01-08: 4 mg via INTRAVENOUS

## 2024-01-08 MED ORDER — PROPOFOL 1000 MG/100ML IV EMUL
INTRAVENOUS | Status: AC
  Filled 2024-01-08: qty 100

## 2024-01-08 MED ORDER — CHLORHEXIDINE GLUCONATE CLOTH 2 % EX PADS: 6.0000 | MEDICATED_PAD | Freq: Once | CUTANEOUS | Status: DC

## 2024-01-08 MED ORDER — FENTANYL CITRATE (PF) 100 MCG/2ML IJ SOLN
INTRAMUSCULAR | Status: DC | PRN
  Administered 2024-01-08: 50 ug via INTRAVENOUS

## 2024-01-08 MED ORDER — PROPOFOL 10 MG/ML IV BOLUS
INTRAVENOUS | Status: DC | PRN
  Administered 2024-01-08: 20 mg via INTRAVENOUS
  Administered 2024-01-08 (×3): 10 mg via INTRAVENOUS

## 2024-01-08 MED ORDER — LIDOCAINE HCL 1 % IJ SOLN
INTRAMUSCULAR | Status: DC | PRN
  Administered 2024-01-08: 40 mL

## 2024-01-08 MED ORDER — ACETAMINOPHEN 500 MG PO TABS
1000.0000 mg | ORAL_TABLET | Freq: Once | ORAL | Status: AC
  Administered 2024-01-08: 1000 mg via ORAL
  Filled 2024-01-08: qty 2

## 2024-01-08 MED ORDER — FENTANYL CITRATE (PF) 100 MCG/2ML IJ SOLN
INTRAMUSCULAR | Status: AC
  Filled 2024-01-08: qty 2

## 2024-01-08 MED ORDER — OXYCODONE HCL 5 MG/5ML PO SOLN: 5.0000 mg | Freq: Once | ORAL | Status: DC | PRN

## 2024-01-08 MED ORDER — EPHEDRINE SULFATE-NACL 50-0.9 MG/10ML-% IV SOSY
PREFILLED_SYRINGE | INTRAVENOUS | Status: DC | PRN
  Administered 2024-01-08: 10 mg via INTRAVENOUS
  Administered 2024-01-08: 15 mg via INTRAVENOUS
  Administered 2024-01-08: 10 mg via INTRAVENOUS

## 2024-01-08 MED ORDER — ONDANSETRON HCL 4 MG/2ML IJ SOLN
INTRAMUSCULAR | Status: AC
  Filled 2024-01-08: qty 2

## 2024-01-08 MED ORDER — AMISULPRIDE (ANTIEMETIC) 5 MG/2ML IV SOLN: 10.0000 mg | Freq: Once | INTRAVENOUS | Status: DC | PRN

## 2024-01-08 MED ORDER — ORAL CARE MOUTH RINSE
15.0000 mL | Freq: Once | OROMUCOSAL | Status: AC
Start: 2024-01-08 — End: 2024-01-08

## 2024-01-08 MED ORDER — LIDOCAINE HCL 1 % IJ SOLN
INTRAMUSCULAR | Status: AC
  Filled 2024-01-08: qty 20

## 2024-01-08 MED ORDER — CIPROFLOXACIN IN D5W 400 MG/200ML IV SOLN
400.0000 mg | INTRAVENOUS | Status: AC
  Administered 2024-01-08: 400 mg via INTRAVENOUS
  Filled 2024-01-08: qty 200

## 2024-01-08 MED ORDER — FENTANYL CITRATE PF 50 MCG/ML IJ SOSY: 25.0000 ug | PREFILLED_SYRINGE | INTRAMUSCULAR | Status: DC | PRN

## 2024-01-08 SURGICAL SUPPLY — 33 items
BAG COUNTER SPONGE SURGICOUNT (BAG) IMPLANT
CHLORAPREP W/TINT 26 (MISCELLANEOUS) ×1 IMPLANT
COVER SURGICAL LIGHT HANDLE (MISCELLANEOUS) ×1 IMPLANT
DERMABOND ADVANCED .7 DNX12 (GAUZE/BANDAGES/DRESSINGS) IMPLANT
DRAPE LAPAROSCOPIC ABDOMINAL (DRAPES) IMPLANT
DRAPE LAPAROTOMY T 102X78X121 (DRAPES) IMPLANT
DRAPE LAPAROTOMY T 98X78 PEDS (DRAPES) IMPLANT
DRAPE LAPAROTOMY TRNSV 102X78 (DRAPES) IMPLANT
DRAPE SHEET LG 3/4 BI-LAMINATE (DRAPES) IMPLANT
DRAPE UTILITY XL STRL (DRAPES) IMPLANT
ELECT REM PT RETURN 15FT ADLT (MISCELLANEOUS) ×1 IMPLANT
GAUZE SPONGE 4X4 12PLY STRL (GAUZE/BANDAGES/DRESSINGS) IMPLANT
GLOVE BIOGEL PI IND STRL 7.0 (GLOVE) ×1 IMPLANT
GLOVE SURG ORTHO 8.0 STRL STRW (GLOVE) ×1 IMPLANT
GLOVE SURG SYN 7.5 E (GLOVE) IMPLANT
GLOVE SURG SYN 7.5 PF PI (GLOVE) ×1 IMPLANT
GOWN STRL REUS W/ TWL XL LVL3 (GOWN DISPOSABLE) ×2 IMPLANT
KIT BASIN OR (CUSTOM PROCEDURE TRAY) ×1 IMPLANT
KIT TURNOVER KIT A (KITS) ×1 IMPLANT
MARKER SKIN DUAL TIP RULER LAB (MISCELLANEOUS) IMPLANT
NDL HYPO 25X1 1.5 SAFETY (NEEDLE) ×1 IMPLANT
NEEDLE HYPO 25X1 1.5 SAFETY (NEEDLE) ×1 IMPLANT
NS IRRIG 1000ML POUR BTL (IV SOLUTION) ×1 IMPLANT
PACK GENERAL/GYN (CUSTOM PROCEDURE TRAY) ×1 IMPLANT
SPIKE FLUID TRANSFER (MISCELLANEOUS) IMPLANT
SPONGE T-LAP 4X18 ~~LOC~~+RFID (SPONGE) IMPLANT
STAPLER SKIN PROX 35W (STAPLE) IMPLANT
STRIP CLOSURE SKIN 1/2X4 (GAUZE/BANDAGES/DRESSINGS) IMPLANT
SUT MNCRL AB 4-0 PS2 18 (SUTURE) IMPLANT
SUT VIC AB 3-0 SH 18 (SUTURE) IMPLANT
SUT VIC AB 3-0 SH 27X BRD (SUTURE) IMPLANT
SYR CONTROL 10ML LL (SYRINGE) ×1 IMPLANT
TOWEL OR 17X26 10 PK STRL BLUE (TOWEL DISPOSABLE) ×1 IMPLANT

## 2024-01-08 NOTE — Interval H&P Note (Signed)
 History and Physical Interval Note:  01/08/2024 6:58 AM  [name]  has presented today for surgery, with the diagnosis of MULTIPLE SOFT TISSUE MASSES OF EXTREMITIES.  The various methods of treatment have been discussed with the patient and family. After consideration of risks, benefits and other options for treatment, the patient has consented to    Procedure(s) with comments: EXCISION, MASS, UPPER EXTREMITY (Right) - RIGHT LOCAL & MAC EXCISION MASS LOWER EXTREMITIES (Right) - RIGHT LOCAL & MAC as a surgical intervention.    The patient's history has been reviewed, patient examined, no change in status, stable for surgery.  I have reviewed the patient's chart and labs.  Questions were answered to the patient's satisfaction.    [name], MD [facility] practice Office: [phone]   [name]

## 2024-01-08 NOTE — Discharge Instructions (Addendum)
  [facility] [place] [facility] -- DISCHARGE INSTRUCTIONS  REMINDER:   Carry a list of your medications and allergies with you at all times  Call your pharmacy at least 1 week in advance to refill prescriptions  Do not mix any prescribed pain medicine with alcohol  Do not drive any motor vehicles while taking pain medication  Take medications with food unless otherwise directed  Follow-up appointments (date to return to physician): Please call [phone] to confirm your follow up appointment with your surgeon.  Call your Surgeon if you have:  Temperature greater than 101.0  Persistent nausea and vomiting  Severe uncontrolled pain  Redness, tenderness, or signs of infection (pain, swelling, redness, odor or green/yellow discharge around the site)  Difficulty breathing, headache or visual disturbances  Hives  Persistent dizziness or light-headedness  Any other questions or concerns you may have after discharge  In an emergency, call 911 or go to an Emergency Department at a nearby hospital.  Diet: Begin with liquids, and if they are tolerated, resume your usual diet.  Avoid spicy, greasy or heavy foods.  If you have nausea or vomiting, go back to liquids.  If you cannot keep liquids down, call your doctor.  Avoid alcohol consumption while on prescription pain medications. Good nutrition promotes healing. Increase fiber and fluids.   ADDITIONAL INSTRUCTIONS: Leave Dermabond 7-10 days.  May shower.  [facility] Office: [phone]

## 2024-01-08 NOTE — Op Note (Signed)
 Operative Note  Pre-operative Diagnosis: #1 soft tissue mass right upper arm, #2 soft tissue mass right proximal anterior thigh, #3 soft tissue mass right distal anterior thigh  Post-operative Diagnosis: Same  Surgeon:  [name], MD  Assistant: None   Procedure: #1 excision soft tissue mass right upper arm, 3 x 2 x 1 cm, subcutaneous; #2 excision soft tissue mass right proximal anterior thigh, 4 x 2.5 x 2 cm, subcutaneous; #3 excision soft tissue mass right anterior inferior thigh, 2 x 2 x 1 cm, subcutaneous  Anesthesia: Local with intravenous sedation  Estimated Blood Loss: Minimal  Drains: None         Specimen: All specimen submitted separately to pathology for review  Indications:  Patient is referred by her dermatologist, Dr. [name], for surgical evaluation and management of multiple soft tissue masses involving the right upper extremity and right lower extremity. Patient states that these have been present for a number of years. They have increased in size and they are causing her discomfort. She desires surgical excision. Patient has not had prior such lesions removed.   Procedure:  The patient was seen in the pre-op holding area. The risks, benefits, complications, treatment options, and expected outcomes were previously discussed with the patient. The patient agreed with the proposed plan and has signed the informed consent form.  The patient was brought to the operating room by the surgical team, identified as Niels LITTIE Sands and the procedure verified. A time out was completed and the above information confirmed.  Following administration of intravenous sedation, the patient was prepped and draped in the usual aseptic fashion.  After ascertaining that an adequate level of anesthesia been achieved, the skin overlying the mass in the right upper arm is anesthetized with local anesthetic.  Incision is made with a #15 blade.  Dissection is carried in the subcutaneous  tissues and hemostasis achieved with the electrocautery.  Soft tissue mass is identified.  It is dissected out and excised in its entirety using the electrocautery for hemostasis.  The mass measures 3 x 2 x 1 cm in size and is submitted in its entirety to pathology.  Subcutaneous tissues are closed with interrupted 3-0 Vicryl sutures.  Skin is closed with a running 4-0 Monocryl subcuticular suture.  Wound is washed and dried and Dermabond is applied as dressing.  The operative field is reset with new prep and new drapes.  A second timeout is completed.  Next the skin overlying each mass in the right anterior thigh is anesthetized with local anesthetic.  Incision is made over each mass with a #15 blade.  Dissection is carried into the subcutaneous tissues.  A soft tissue mass is noted at each operative site.  It is consistent with a lipoma.  It is gently mobilized.  Vascular pedicles divided with the electrocautery and the masses are both excised.  The right inferior thyroid  mass measures 2 x 2 x 1 cm and is submitted in its entirety to pathology.  The right proximal thigh mass measures 4 x 2.5 x 2 cm and is also submitted in its entirety to pathology for review.  Good hemostasis is noted.  Subcutaneous tissues at each wound are closed with interrupted 3-0 Vicryl sutures.  Both incisions are then closed with a running 4-0 Monocryl subcuticular suture.  Wounds are washed and dried and Dermabond is applied as dressing.  Patient is awakened from anesthesia and transferred to the recovery room.  The patient tolerated the procedure well.  [name], MD [facility] Office: [phone]

## 2024-01-08 NOTE — Transfer of Care (Signed)
 Immediate Anesthesia Transfer of Care Note  Patient: [name]  Procedure(s) Performed: EXCISION, MASS, UPPER EXTREMITY (Right) EXCISION MASS LOWER EXTREMITIES (Right)  Patient Location: PACU  Anesthesia Type:MAC  Level of Consciousness: sedated  Airway & Oxygen Therapy: Patient Spontanous Breathing and Patient connected to face mask oxygen  Post-op Assessment: Report given to RN and Post -op Vital signs reviewed and stable  Post vital signs: Reviewed and stable  Last Vitals:  Vitals Value Taken Time  BP    Temp    Pulse    Resp    SpO2      Last Pain:  Vitals:   01/08/24 0605  TempSrc:   PainSc: 0-No pain         Complications: No notable events documented.

## 2024-01-08 NOTE — Anesthesia Postprocedure Evaluation (Signed)
 Anesthesia Post Note  Patient: [name]  Procedure(s) Performed: EXCISION, MASS, UPPER EXTREMITY (Right) EXCISION MASS LOWER EXTREMITIES (Right)     Patient location during evaluation: PACU Anesthesia Type: MAC Level of consciousness: awake Pain management: pain level controlled Vital Signs Assessment: post-procedure vital signs reviewed and stable Respiratory status: spontaneous breathing, nonlabored ventilation and respiratory function stable Cardiovascular status: stable and blood pressure returned to baseline Postop Assessment: no apparent nausea or vomiting Anesthetic complications: no   No notable events documented.  Last Vitals:  Vitals:   01/08/24 0845 01/08/24 0900  BP: (!) 132/58 139/71  Pulse: (!) 54 (!) 49  Resp: 20 14  Temp:    SpO2: 94% 96%    Last Pain:  Vitals:   01/08/24 0900  TempSrc:   PainSc: 0-No pain                 Delon Aisha Arch

## 2024-01-09 ENCOUNTER — Encounter (HOSPITAL_COMMUNITY): Payer: Self-pay | Admitting: Surgery

## 2024-01-12 ENCOUNTER — Ambulatory Visit: Payer: Self-pay | Admitting: Surgery

## 2024-01-12 LAB — SURGICAL PATHOLOGY

## 2024-01-12 NOTE — Progress Notes (Signed)
 Pathology is benign, as expected.  Darnell Level, MD [facility] practice Office: [phone]
